# Patient Record
Sex: Male | Born: 1970 | Race: White | Hispanic: No | Marital: Single | Smoking: Never smoker
Health system: Southern US, Community
[De-identification: ages and names within clinical notes are randomized; demographics above are authoritative.]

---

## 2001-09-02 ENCOUNTER — Emergency Department (HOSPITAL_COMMUNITY): Admission: EM | Admit: 2001-09-02 | Discharge: 2001-09-02 | Payer: Self-pay | Admitting: Emergency Medicine

## 2002-07-28 ENCOUNTER — Emergency Department (HOSPITAL_COMMUNITY): Admission: EM | Admit: 2002-07-28 | Discharge: 2002-07-28 | Payer: Self-pay | Admitting: *Deleted

## 2002-07-28 ENCOUNTER — Encounter: Payer: Self-pay | Admitting: *Deleted

## 2013-04-20 ENCOUNTER — Emergency Department (HOSPITAL_COMMUNITY): Payer: Self-pay

## 2013-04-20 ENCOUNTER — Encounter (HOSPITAL_COMMUNITY): Payer: Self-pay | Admitting: Emergency Medicine

## 2013-04-20 ENCOUNTER — Emergency Department (HOSPITAL_COMMUNITY)
Admission: EM | Admit: 2013-04-20 | Discharge: 2013-04-20 | Disposition: A | Discharge status: Home / Self Care | Payer: Self-pay | Attending: Emergency Medicine | Admitting: Emergency Medicine

## 2013-04-20 DIAGNOSIS — R141 Gas pain: Secondary | ICD-10-CM | POA: Insufficient documentation

## 2013-04-20 DIAGNOSIS — R142 Eructation: Secondary | ICD-10-CM | POA: Insufficient documentation

## 2013-04-20 DIAGNOSIS — R109 Unspecified abdominal pain: Secondary | ICD-10-CM

## 2013-04-20 DIAGNOSIS — R197 Diarrhea, unspecified: Secondary | ICD-10-CM | POA: Insufficient documentation

## 2013-04-20 DIAGNOSIS — R1033 Periumbilical pain: Secondary | ICD-10-CM | POA: Insufficient documentation

## 2013-04-20 DIAGNOSIS — Z880 Allergy status to penicillin: Secondary | ICD-10-CM | POA: Insufficient documentation

## 2013-04-20 DIAGNOSIS — K625 Hemorrhage of anus and rectum: Secondary | ICD-10-CM | POA: Insufficient documentation

## 2013-04-20 DIAGNOSIS — K920 Hematemesis: Secondary | ICD-10-CM | POA: Insufficient documentation

## 2013-04-20 LAB — CBC WITH DIFFERENTIAL/PLATELET
Basophils Relative: 1 % (ref 0–1)
Eosinophils Absolute: 0.2 10*3/uL (ref 0.0–0.7)
Lymphs Abs: 1.7 10*3/uL (ref 0.7–4.0)
MCH: 30.2 pg (ref 26.0–34.0)
MCHC: 34.7 g/dL (ref 30.0–36.0)
Monocytes Absolute: 0.5 10*3/uL (ref 0.1–1.0)
Neutrophils Relative %: 54 % (ref 43–77)
Platelets: 286 10*3/uL (ref 150–400)
RDW: 13.4 % (ref 11.5–15.5)
WBC: 5.2 10*3/uL (ref 4.0–10.5)

## 2013-04-20 LAB — COMPREHENSIVE METABOLIC PANEL
ALT: 35 U/L (ref 0–53)
AST: 28 U/L (ref 0–37)
Albumin: 4.2 g/dL (ref 3.5–5.2)
Alkaline Phosphatase: 78 U/L (ref 39–117)
Calcium: 9.5 mg/dL (ref 8.4–10.5)
Potassium: 4.1 mEq/L (ref 3.7–5.3)
Sodium: 141 mEq/L (ref 137–147)
Total Protein: 8.3 g/dL (ref 6.0–8.3)

## 2013-04-20 LAB — URINALYSIS, ROUTINE W REFLEX MICROSCOPIC
Bilirubin Urine: NEGATIVE
Glucose, UA: NEGATIVE mg/dL
Hgb urine dipstick: NEGATIVE
Ketones, ur: NEGATIVE mg/dL
Leukocytes, UA: NEGATIVE
Nitrite: NEGATIVE
Protein, ur: NEGATIVE mg/dL
pH: 6.5 (ref 5.0–8.0)

## 2013-04-20 LAB — LIPASE, BLOOD: Lipase: 16 U/L (ref 11–59)

## 2013-04-20 MED ORDER — IOHEXOL 300 MG/ML  SOLN
100.0000 mL | Freq: Once | INTRAMUSCULAR | Status: AC | PRN
  Administered 2013-04-20: 100 mL via INTRAVENOUS

## 2013-04-20 MED ORDER — ONDANSETRON HCL 4 MG/2ML IJ SOLN
4.0000 mg | Freq: Once | INTRAMUSCULAR | Status: AC
  Administered 2013-04-20: 4 mg via INTRAVENOUS
  Filled 2013-04-20: qty 2

## 2013-04-20 MED ORDER — IOHEXOL 300 MG/ML  SOLN
50.0000 mL | Freq: Once | INTRAMUSCULAR | Status: AC | PRN
  Administered 2013-04-20: 50 mL via ORAL

## 2013-04-20 MED ORDER — MORPHINE SULFATE 4 MG/ML IJ SOLN
4.0000 mg | Freq: Once | INTRAMUSCULAR | Status: AC
  Administered 2013-04-20: 4 mg via INTRAVENOUS
  Filled 2013-04-20: qty 1

## 2013-04-20 MED ORDER — PROMETHAZINE HCL 25 MG PO TABS: 25.0000 mg | ORAL_TABLET | Freq: Four times a day (QID) | ORAL | Status: DC | PRN

## 2013-04-20 MED ORDER — OXYCODONE-ACETAMINOPHEN 5-325 MG PO TABS: 2.0000 | ORAL_TABLET | ORAL | Status: DC | PRN

## 2013-04-20 NOTE — ED Notes (Signed)
Patient given discharge instruction, verbalized understand. IV removed, band aid applied. Patient ambulatory out of the department.  

## 2013-04-20 NOTE — ED Notes (Signed)
Pt. Reports abdominal pain 5/10 sharp, states pain worse with movement. Pt. Reports diarrhea x3 weeks. Pt. Reports blood in vomit and stool.

## 2013-04-20 NOTE — ED Notes (Signed)
Pt had abdominal pain x4 weeks, today pain is worse, abdomen is distended, vomiting started today, stools noticed dark stools, diarrhea and bright red blood

## 2013-04-20 NOTE — ED Provider Notes (Signed)
CSN: [id number]     Arrival date & time 04/20/13  1144 History  This chart was scribed for Donnetta Hutching, MD by Dorothey Baseman, ED Scribe. This patient was seen in room APA09/APA09 and the patient's care was started at 1:52 PM.    Chief Complaint  Patient presents with  . Abdominal Pain  . Rectal Bleeding  . Hematemesis   The history is provided by the patient. No language interpreter was used.   HPI Comments: William Francis is a 42 y.o. male who presents to the Emergency Department complaining of a constant pain to the periumbilical region of the abdomen with associated abdominal bloating and diarrhea onset 3-4 weeks ago. He reports one episode of emesis today. He reports that he has been handling food and fluids relatively well, but has not eaten today. Patient reports that these symptoms are new for him. He denies history of abdominal surgery. Patient has no other pertinent medical history. Patient does not drink or smoke.  History reviewed. No pertinent past medical history. History reviewed. No pertinent past surgical history. History reviewed. No pertinent family history. History  Substance Use Topics  . Smoking status: Not on file  . Smokeless tobacco: Not on file  . Alcohol Use: Not on file    Review of Systems  A complete 10 system review of systems was obtained and all systems are negative except as noted in the HPI and PMH.   Allergies  Penicillins  Home Medications   Current Outpatient Rx  Name  Route  Sig  Dispense  Refill  . hydrocortisone (CORTIZONE-10 ECZEMA) 1 % lotion   Topical   Apply 1 application topically daily as needed for itching (skin irritation).         Marland Kitchen oxyCODONE-acetaminophen (PERCOCET) 5-325 MG per tablet   Oral   Take 2 tablets by mouth every 4 (four) hours as needed.   20 tablet   0   . promethazine (PHENERGAN) 25 MG tablet   Oral   Take 1 tablet (25 mg total) by mouth every 6 (six) hours as needed for nausea or vomiting.   20 tablet   0      Triage Vitals: BP 136/92  Pulse 86  Temp(Src) 98.5 F (36.9 C) (Oral)  Resp 20  Ht 6\' 1"  (1.854 m)  Wt 220 lb (99.791 kg)  BMI 29.03 kg/m2  SpO2 96%  Physical Exam  Nursing note and vitals reviewed. Constitutional: He is oriented to person, place, and time. He appears well-developed and well-nourished. No distress.  HENT:  Head: Normocephalic and atraumatic.  Eyes: Conjunctivae are normal.  Neck: Normal range of motion. Neck supple.  Cardiovascular: Normal rate, regular rhythm and normal heart sounds.   Pulmonary/Chest: Effort normal and breath sounds normal. No respiratory distress.  Abdominal: Soft. He exhibits distension. There is tenderness.  Abdomen is protuberant. Diffuse tenderness to palpation.  Musculoskeletal: Normal range of motion.  Neurological: He is alert and oriented to person, place, and time.  Skin: Skin is warm and dry.  Psychiatric: He has a normal mood and affect. His behavior is normal.    ED Course  Procedures (including critical care time)  DIAGNOSTIC STUDIES: Oxygen Saturation is 96% on room air, normal by my interpretation.    COORDINATION OF CARE: 1:54 PM- Will order a CT of the abdomen, blood labs, and UA. Will order Omnipaque, morphine, and Zofran to manage symptoms. Discussed treatment plan with patient at bedside and patient verbalized agreement.  Labs Review Labs Reviewed  COMPREHENSIVE METABOLIC PANEL - Abnormal; Notable for the following:    Glucose, Bld 100 (*)    Total Bilirubin 0.2 (*)    GFR calc non Af Amer 89 (*)    All other components within normal limits  CBC WITH DIFFERENTIAL  LIPASE, BLOOD  URINALYSIS, ROUTINE W REFLEX MICROSCOPIC   Imaging Review Ct Abdomen Pelvis W Contrast  04/20/2013   CLINICAL DATA:  Vomiting.  Abdominal pain.  EXAM: CT ABDOMEN AND PELVIS WITH CONTRAST  TECHNIQUE: Multidetector CT imaging of the abdomen and pelvis was performed using the standard protocol following bolus administration of  intravenous contrast.  CONTRAST:  50mL OMNIPAQUE IOHEXOL 300 MG/ML SOLN, OMNIPAQUE IOHEXOL 300 MG/ML SOLN  COMPARISON:  Lung bases are clear.  No pleural or pericardial fluid.  There is mild fatty change of the liver. No focal lesions or ductal dilatation. No calcified gallstones. The spleen is normal. The pancreas is normal. The adrenal glands are normal. The kidneys are normal. The aorta and IVC are normal. No retroperitoneal mass or adenopathy. No free intraperitoneal fluid or air. No sign of bowel obstruction. The appendix is very slightly dilated, showing a diameter of up to 9 mm. One could question early inflammatory change around the appendix. I think this examination raises the possibility of early appendicitis, but I cannot make that diagnosis with certainty.  Bladder, prostate gland and seminal vesicles are unremarkable. No significant bony finding.  FINDINGS: The appendix is borderline abnormal. Diameter is up to 9 mm. I question if there could be very early stranding around the appendix. Correlation with clinical presentation and laboratory findings is obviously appropriate. At the very least, close clinical follow-up would be suggested. The study does not allow confident diagnosis of appendicitis at this moment.   Electronically Signed   By: Paulina Fusi M.D.   On: 04/20/2013 15:58    EKG Interpretation   None       MDM   1. Abdominal pain    No acute abdomen. White count normal. Discussed findings with patient. Also discussed case with Dr. Franky Macho.  Patient understands to return if pain settles in right lower abdomen which could be a sign of appendicitis.  Medication for pain and nausea.  I personally performed the services described in this documentation, which was scribed in my presence. The recorded information has been reviewed and is accurate.     Donnetta Hutching, MD 04/20/13 [phone]

## 2013-09-25 ENCOUNTER — Emergency Department (HOSPITAL_COMMUNITY)
Admission: EM | Admit: 2013-09-25 | Discharge: 2013-09-25 | Disposition: A | Discharge status: Home / Self Care | Payer: Self-pay | Attending: Emergency Medicine | Admitting: Emergency Medicine

## 2013-09-25 ENCOUNTER — Encounter (HOSPITAL_COMMUNITY): Payer: Self-pay | Admitting: Emergency Medicine

## 2013-09-25 DIAGNOSIS — IMO0002 Reserved for concepts with insufficient information to code with codable children: Secondary | ICD-10-CM | POA: Insufficient documentation

## 2013-09-25 DIAGNOSIS — S0500XA Injury of conjunctiva and corneal abrasion without foreign body, unspecified eye, initial encounter: Secondary | ICD-10-CM

## 2013-09-25 DIAGNOSIS — Y929 Unspecified place or not applicable: Secondary | ICD-10-CM | POA: Insufficient documentation

## 2013-09-25 DIAGNOSIS — Z880 Allergy status to penicillin: Secondary | ICD-10-CM | POA: Insufficient documentation

## 2013-09-25 DIAGNOSIS — S058X9A Other injuries of unspecified eye and orbit, initial encounter: Secondary | ICD-10-CM | POA: Insufficient documentation

## 2013-09-25 DIAGNOSIS — Y939 Activity, unspecified: Secondary | ICD-10-CM | POA: Insufficient documentation

## 2013-09-25 MED ORDER — TETRACAINE HCL 0.5 % OP SOLN
1.0000 [drp] | Freq: Once | OPHTHALMIC | Status: AC
  Administered 2013-09-25: 1 [drp] via OPHTHALMIC

## 2013-09-25 MED ORDER — IBUPROFEN 200 MG PO TABS
400.0000 mg | ORAL_TABLET | Freq: Once | ORAL | Status: AC
  Administered 2013-09-25: 400 mg via ORAL
  Filled 2013-09-25: qty 2

## 2013-09-25 MED ORDER — CIPROFLOXACIN HCL 0.3 % OP OINT: TOPICAL_OINTMENT | OPHTHALMIC | Status: DC

## 2013-09-25 MED ORDER — OXYCODONE-ACETAMINOPHEN 5-325 MG PO TABS
1.0000 | ORAL_TABLET | Freq: Once | ORAL | Status: AC
  Administered 2013-09-25: 1 via ORAL
  Filled 2013-09-25: qty 1

## 2013-09-25 MED ORDER — TETRACAINE HCL 0.5 % OP SOLN
1.0000 [drp] | Freq: Once | OPHTHALMIC | Status: DC
Start: 2013-09-25 — End: 2013-09-25
  Filled 2013-09-25: qty 2

## 2013-09-25 MED ORDER — OXYCODONE-ACETAMINOPHEN 5-325 MG PO TABS: 1.0000 | ORAL_TABLET | ORAL | Status: DC | PRN

## 2013-09-25 MED ORDER — TETRACAINE HCL 0.5 % OP SOLN
1.0000 [drp] | Freq: Once | OPHTHALMIC | Status: AC
  Administered 2013-09-25: 1 [drp] via OPHTHALMIC
  Filled 2013-09-25: qty 2

## 2013-09-25 MED ORDER — FLUORESCEIN SODIUM 1 MG OP STRP
1.0000 | ORAL_STRIP | Freq: Once | OPHTHALMIC | Status: AC
  Administered 2013-09-25: 1 via OPHTHALMIC
  Filled 2013-09-25: qty 1

## 2013-09-25 NOTE — ED Provider Notes (Signed)
CSN: [id number]     Arrival date & time 09/25/13  0569 History   First MD Initiated Contact with Patient 09/25/13 [phone]     Chief Complaint  Patient presents with  . Eye Injury  . Eye Pain     (Consider location/radiation/quality/duration/timing/severity/associated sxs/prior Treatment) HPI  Patient presents with left eye pain.  States someone was holding a door for him last night and when they let go, a branch that was also being held back whipped back and hit him in the left eye.  It was a small pine tree branch.  States he drove to the hospital last night but felt better when he got here so he decided to go home.  Woke up this morning with worse pain.  States it feels like something is in his eye, constantly scratching it.  Vision is blurry.  Clear discharge.  Denies other injury.   He has not used any medication or drops in his eye.  Pain is moderate, located only in the left eye, feels like something "scratching", no radiation.    History reviewed. No pertinent past medical history. History reviewed. No pertinent past surgical history. No family history on file. History  Substance Use Topics  . Smoking status: Never Smoker   . Smokeless tobacco: Not on file  . Alcohol Use: No    Review of Systems  All other systems reviewed and are negative.     Allergies  Penicillins  Home Medications   Prior to Admission medications   Not on File   BP 132/94  Pulse 76  Temp(Src) 97.7 F (36.5 C) (Oral)  Resp 20  SpO2 99% Physical Exam  Nursing note and vitals reviewed. Constitutional: He appears well-developed and well-nourished. No distress.  HENT:  Head: Normocephalic and atraumatic.  Eyes: EOM and lids are normal. Pupils are equal, round, and reactive to light. Lids are everted and swept, no foreign bodies found. Right eye exhibits no discharge. Left eye exhibits no discharge. Right conjunctiva is not injected. Right conjunctiva has no hemorrhage. Left conjunctiva is injected.  Left conjunctiva has no hemorrhage. No scleral icterus.  Slit lamp exam:      The left eye shows corneal abrasion.    Neck: Neck supple.  Pulmonary/Chest: Effort normal.  Neurological: He is alert.  Skin: He is not diaphoretic.    ED Course  Procedures (including critical care time) Labs Review Labs Reviewed - No data to display  Imaging Review No results found.   EKG Interpretation None      MDM   Final diagnoses:  Corneal abrasion    Pt with corneal abrasion of left eye that occurred last night.  Tdap is up to date (past 3 years).  He does not wear contacts.  No e/o FB on exam.  No e/o globe rupture.  D/C home with percocet and cipro ophthalmic drops with ophthalmology follow up.  Discussed findings, treatment, and follow up  with patient.  Pt given return precautions.  Pt verbalizes understanding and agrees with plan.        Honea Path, PA-C 09/25/13 [phone]

## 2013-09-25 NOTE — Discharge Instructions (Signed)
Read the information below.  Use the prescribed medication as directed.  Please discuss all new medications with your pharmacist.  Do not take additional tylenol while taking the prescribed pain medication to avoid overdose.  You Beadles return to the Emergency Department at any time for worsening condition or any new symptoms that concern you.  If you develop uncontrolled pain, change in your vision, abnormal discharge from your eye, see the eye doctor listed above or return to the Emergency Department for a recheck.    Corneal Abrasion The cornea is the clear covering at the front and center of the eye. When looking at the colored portion of the eye (iris), you are looking through the cornea. This very thin tissue is made up of many layers. The surface layer is a single layer of cells (corneal epithelium) and is one of the most sensitive tissues in the body. If a scratch or injury causes the corneal epithelium to come off, it is called a corneal abrasion. If the injury extends to the tissues below the epithelium, the condition is called a corneal ulcer. CAUSES   Scratches.  Trauma.  Foreign body in the eye. Some people have recurrences of abrasions in the area of the original injury even after it has healed (recurrent erosion syndrome). Recurrent erosion syndrome generally improves and goes away with time. SYMPTOMS   Eye pain.  Difficulty or inability to keep the injured eye open.  The eye becomes very sensitive to light.  Recurrent erosions tend to happen suddenly, first thing in the morning, usually after waking up and opening the eye. DIAGNOSIS  Your health care provider can diagnose a corneal abrasion during an eye exam. Dye is usually placed in the eye using a drop or a small paper strip moistened by your tears. When the eye is examined with a special light, the abrasion shows up clearly because of the dye. TREATMENT   Small abrasions [place] be treated with antibiotic drops or ointment  alone.  Usually a pressure patch is specially applied. Pressure patches prevent the eye from blinking, allowing the corneal epithelium to heal. A pressure patch also reduces the amount of pain present in the eye during healing. Most corneal abrasions heal within 2 3 days with no effect on vision. If the abrasion becomes infected and spreads to the deeper tissues of the cornea, a corneal ulcer can result. This is serious because it can cause corneal scarring. Corneal scars interfere with light passing through the cornea and cause a loss of vision in the involved eye. HOME CARE INSTRUCTIONS  Use medicine or ointment as directed. Only take over-the-counter or prescription medicines for pain, discomfort, or fever as directed by your health care provider.  Do not drive or operate machinery while your eye is patched. Your ability to judge distances is impaired.  If your health care provider has given you a follow-up appointment, it is very important to keep that appointment. Not keeping the appointment could result in a severe eye infection or permanent loss of vision. If there is any problem keeping the appointment, let your health care provider know. SEEK MEDICAL CARE IF:   You have pain, light sensitivity, and a scratchy feeling in one eye or both eyes.  Your pressure patch keeps loosening up, and you can blink your eye under the patch after treatment.  Any kind of discharge develops from the eye after treatment or if the lids stick together in the morning.  You have the same symptoms in  the morning as you did with the original abrasion days, weeks, or months after the abrasion healed. MAKE SURE YOU:   Understand these instructions.  Will watch your condition.  Will get help right away if you are not doing well or get worse. Document Released: 04/05/2000 Document Revised: 01/27/2013 Document Reviewed: 12/14/2012 [facility] Patient Information 2014 [place], [place].

## 2013-09-25 NOTE — ED Notes (Signed)
Pt declined W/C at D/C and Pt was escorted to [facility] by [employer]

## 2013-09-25 NOTE — ED Notes (Signed)
C/o L eye injury, pine tree branch hit me in the face/ L eye, L eye irritated, red, tearing. "feels like there is something in my eye". Rates L eye pain 6/10. PERRL 88mm, brisk.

## 2013-09-25 NOTE — ED Provider Notes (Signed)
Medical screening examination/treatment/procedure(s) were performed by non-physician practitioner and as supervising physician I was immediately available for consultation/collaboration.    Oshen Wlodarczyk E Fortino Haag, MD 09/25/13 1637 

## 2014-09-28 ENCOUNTER — Encounter (HOSPITAL_BASED_OUTPATIENT_CLINIC_OR_DEPARTMENT_OTHER): Payer: Self-pay

## 2014-09-28 ENCOUNTER — Emergency Department (HOSPITAL_BASED_OUTPATIENT_CLINIC_OR_DEPARTMENT_OTHER)
Admission: EM | Admit: 2014-09-28 | Discharge: 2014-09-29 | Disposition: A | Discharge status: Home / Self Care | Payer: Self-pay | Attending: Emergency Medicine | Admitting: Emergency Medicine

## 2014-09-28 DIAGNOSIS — L03114 Cellulitis of left upper limb: Secondary | ICD-10-CM | POA: Insufficient documentation

## 2014-09-28 DIAGNOSIS — Z880 Allergy status to penicillin: Secondary | ICD-10-CM | POA: Insufficient documentation

## 2014-09-28 LAB — CBC WITH DIFFERENTIAL/PLATELET
Basophils Absolute: 0 10*3/uL (ref 0.0–0.1)
Basophils Relative: 0 % (ref 0–1)
EOS ABS: 0.3 10*3/uL (ref 0.0–0.7)
Eosinophils Relative: 3 % (ref 0–5)
HCT: 41.9 % (ref 39.0–52.0)
Hemoglobin: 14.2 g/dL (ref 13.0–17.0)
Lymphocytes Relative: 20 % (ref 12–46)
Lymphs Abs: 2 10*3/uL (ref 0.7–4.0)
MCH: 29.5 pg (ref 26.0–34.0)
MCHC: 33.9 g/dL (ref 30.0–36.0)
MCV: 86.9 fL (ref 78.0–100.0)
Monocytes Absolute: 1.1 10*3/uL — ABNORMAL HIGH (ref 0.1–1.0)
Monocytes Relative: 11 % (ref 3–12)
NEUTROS ABS: 6.6 10*3/uL (ref 1.7–7.7)
NEUTROS PCT: 66 % (ref 43–77)
PLATELETS: 274 10*3/uL (ref 150–400)
RBC: 4.82 MIL/uL (ref 4.22–5.81)
RDW: 13.8 % (ref 11.5–15.5)
WBC: 10 10*3/uL (ref 4.0–10.5)

## 2014-09-28 LAB — BASIC METABOLIC PANEL
Anion gap: 5 (ref 5–15)
BUN: 16 mg/dL (ref 6–20)
CALCIUM: 8.6 mg/dL — AB (ref 8.9–10.3)
CO2: 28 mmol/L (ref 22–32)
CREATININE: 0.95 mg/dL (ref 0.61–1.24)
Chloride: 104 mmol/L (ref 101–111)
Glucose, Bld: 106 mg/dL — ABNORMAL HIGH (ref 65–99)
POTASSIUM: 3.9 mmol/L (ref 3.5–5.1)
Sodium: 137 mmol/L (ref 135–145)

## 2014-09-28 MED ORDER — VANCOMYCIN HCL IN DEXTROSE 1-5 GM/200ML-% IV SOLN
1000.0000 mg | Freq: Once | INTRAVENOUS | Status: AC
  Administered 2014-09-28: 1000 mg via INTRAVENOUS
  Filled 2014-09-28: qty 200

## 2014-09-28 NOTE — ED Notes (Signed)
Redness to left forearm to areas from tattoo last night

## 2014-09-28 NOTE — ED Provider Notes (Signed)
CSN: [id number]     Arrival date & time 09/28/14  2105 History  This chart was scribed for William PorterMark Sade Hollon, MD by Octavia HeirArianna Nassar, ED Scribe. This patient was seen in room MH12/MH12 and the patient's care was started at 10:35 PM.    Chief Complaint  Patient presents with  . Cellulitis     The history is provided by the patient. No language interpreter was used.    HPI Comments: Anselm PancoastWilliam J Meriweather is a 44 y.o. male who presents to the Emergency Department complaining of constant, gradual worsening left forearm pain onset last night. Pt has associated redness. Pt received a circumferential tattoo last night around his left forearm when he noticed redness, swelling, and draining from the tattoo. Pt denies fevers, shakes and chills. Pt is allergic to penicillin.   History reviewed. No pertinent past medical history. History reviewed. No pertinent past surgical history. No family history on file. History  Substance Use Topics  . Smoking status: Never Smoker   . Smokeless tobacco: Not on file  . Alcohol Use: No    Review of Systems  Constitutional: Negative for fever, chills, diaphoresis, appetite change and fatigue.  HENT: Negative for mouth sores, sore throat and trouble swallowing.   Eyes: Negative for visual disturbance.  Respiratory: Negative for cough, chest tightness, shortness of breath and wheezing.   Cardiovascular: Negative for chest pain.  Gastrointestinal: Negative for nausea, vomiting, abdominal pain, diarrhea and abdominal distention.  Endocrine: Negative for polydipsia, polyphagia and polyuria.  Genitourinary: Negative for dysuria, frequency and hematuria.  Musculoskeletal: Negative for gait problem.  Skin: Positive for wound. Negative for color change, pallor and rash.  Neurological: Negative for dizziness, syncope, light-headedness and headaches.  Hematological: Does not bruise/bleed easily.  Psychiatric/Behavioral: Negative for behavioral problems and confusion.       Allergies  Penicillins  Home Medications   Prior to Admission medications   Medication Sig Start Date End Date Taking? Authorizing Provider  clindamycin (CLEOCIN) 300 MG capsule Take 1 capsule (300 mg total) by mouth 3 (three) times daily. 09/29/14   William PorterMark Nadalyn Deringer, MD  HYDROcodone-acetaminophen (NORCO/VICODIN) 5-325 MG per tablet Take 1 tablet by mouth every 4 (four) hours as needed. 09/29/14   William PorterMark Rashel Okeefe, MD   Triage vitals: BP 126/87 mmHg  Pulse 94  Temp(Src) 98.6 F (37 C) (Oral)  Resp 18  SpO2 100% Physical Exam  Constitutional: He is oriented to person, place, and time. He appears well-developed and well-nourished. No distress.  HENT:  Head: Normocephalic.  Eyes: Conjunctivae are normal. Pupils are equal, round, and reactive to light. No scleral icterus.  Neck: Normal range of motion. Neck supple. No thyromegaly present.  Cardiovascular: Normal rate and regular rhythm.  Exam reveals no gallop and no friction rub.   No murmur heard. Pulmonary/Chest: Effort normal and breath sounds normal. No respiratory distress. He has no wheezes. He has no rales.  Abdominal: Soft. Bowel sounds are normal. He exhibits no distension. There is no tenderness. There is no rebound.  Musculoskeletal: Normal range of motion.  Neurological: He is alert and oriented to person, place, and time.  Skin: Skin is warm and dry. No rash noted.  Circumferential tattooing , no crepitus  4cm lymphangitis left medial distal upper arm Diffuse cellulitis of the left forearm  Psychiatric: He has a normal mood and affect. His behavior is normal.  Nursing note and vitals reviewed.   ED Course  Procedures  DIAGNOSTIC STUDIES: Oxygen Saturation is 100% on RA, normal by my  interpretation.  COORDINATION OF CARE:  10:38 PM Discussed treatment plan which includes antibiotics and pain medication with pt at bedside and pt agreed to plan.   Labs Review Labs Reviewed  CBC WITH DIFFERENTIAL/PLATELET - Abnormal;  Notable for the following:    Monocytes Absolute 1.1 (*)    All other components within normal limits  BASIC METABOLIC PANEL - Abnormal; Notable for the following:    Glucose, Bld 106 (*)    Calcium 8.6 (*)    All other components within normal limits    Imaging Review No results found.   EKG Interpretation None      MDM   Final diagnoses:  Cellulitis of left upper extremity    I personally performed the services described in this documentation, which was scribed in my presence. The recorded information has been reviewed and is accurate.   William Porter, MD 10/05/14 [phone]

## 2014-09-28 NOTE — ED Notes (Signed)
Redness to inner forearm marked

## 2014-09-28 NOTE — ED Notes (Signed)
Pt stopped triage process to take a phone call

## 2014-09-29 MED ORDER — CLINDAMYCIN HCL 300 MG PO CAPS: 300.0000 mg | ORAL_CAPSULE | Freq: Three times a day (TID) | ORAL | Status: DC

## 2014-09-29 MED ORDER — HYDROCODONE-ACETAMINOPHEN 5-325 MG PO TABS
1.0000 | ORAL_TABLET | ORAL | Status: DC | PRN
Start: 2014-09-29 — End: 2023-10-16

## 2014-09-29 MED ORDER — ONDANSETRON HCL 4 MG/2ML IJ SOLN
4.0000 mg | Freq: Once | INTRAMUSCULAR | Status: AC
  Administered 2014-09-29: 4 mg via INTRAVENOUS
  Filled 2014-09-29: qty 2

## 2014-09-29 MED ORDER — MORPHINE SULFATE 4 MG/ML IJ SOLN
4.0000 mg | INTRAMUSCULAR | Status: DC | PRN
Start: 2014-09-29 — End: 2014-09-29
  Administered 2014-09-29: 4 mg via INTRAVENOUS
  Filled 2014-09-29: qty 1

## 2014-09-29 NOTE — Discharge Instructions (Signed)
Keep your arm elevated. Check here with any worsening symptoms including fever, worsening pain, or spread of the redness higher up your  arm.  Cellulitis Cellulitis is an infection of the skin and the tissue under the skin. The infected area is usually red and tender. This happens most often in the arms and lower legs. HOME CARE   Take your antibiotic medicine as told. Finish the medicine even if you start to feel better.  Keep the infected arm or leg raised (elevated).  Put a warm cloth on the area up to 4 times per day.  Only take medicines as told by your doctor.  Keep all doctor visits as told. GET HELP IF:  You see red streaks on the skin coming from the infected area.  Your red area gets bigger or turns a dark color.  Your bone or joint under the infected area is painful after the skin heals.  Your infection comes back in the same area or different area.  You have a puffy (swollen) bump in the infected area.  You have new symptoms.  You have a fever. GET HELP RIGHT AWAY IF:   You feel very sleepy.  You throw up (vomit) or have watery poop (diarrhea).  You feel sick and have muscle aches and pains. MAKE SURE YOU:   Understand these instructions.  Will watch your condition.  Will get help right away if you are not doing well or get worse. Document Released: 09/25/2007 Document Revised: 08/23/2013 Document Reviewed: 06/24/2011 [facility] Patient Information 2015 [place], [place]. This information is not intended to replace advice given to you by your health care provider. Make sure you discuss any questions you have with your health care provider.

## 2014-10-28 IMAGING — CT CT ABD-PELV W/ CM
2 of 4 series · 16 of 46 positions shown, 18 images · IV contrast (Omnipaque 300)
Comparison: Lung bases are clear.

CLINICAL DATA: Vomiting.  Abdominal pain.

EXAM:
CT ABDOMEN AND PELVIS WITH CONTRAST
TECHNIQUE: Multidetector CT imaging of the abdomen and pelvis was performed
using the standard protocol following bolus administration of
intravenous contrast.
CONTRAST:  50mL OMNIPAQUE IOHEXOL 300 MG/ML SOLN, 100mL OMNIPAQUE
IOHEXOL 300 MG/ML SOLN

[Series 2: abd_pel_with 5.0 b40f · axial · 0.71mm/px · z∈[-517,-22]mm · 13 of 109 slices shown, 15 images]
[im 5/109  soft-tissue]
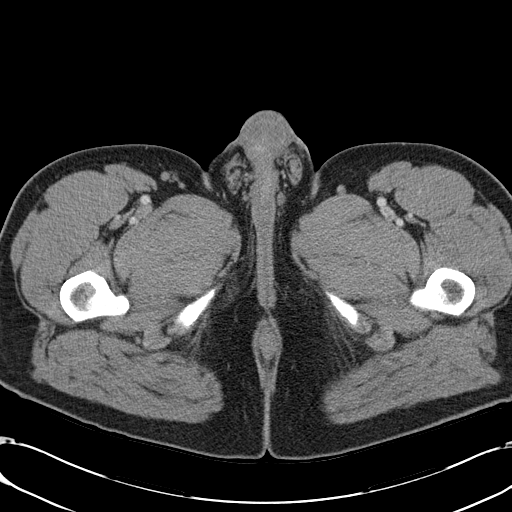
[im 5/109  bone]
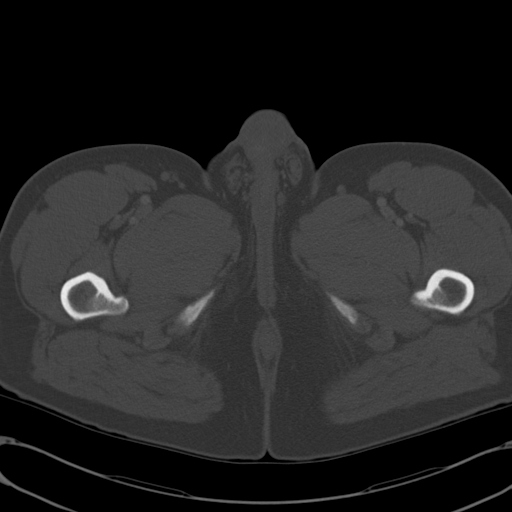
[im 13/109  soft-tissue]
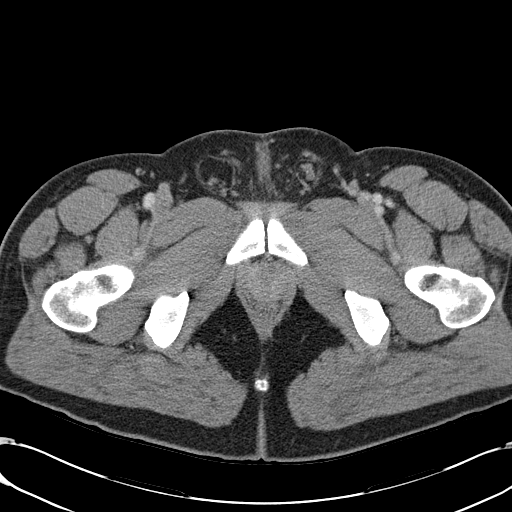
[im 22/109  soft-tissue]
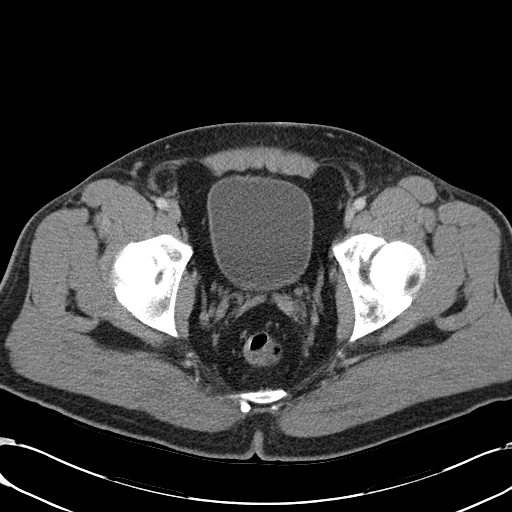
[im 31/109  soft-tissue]
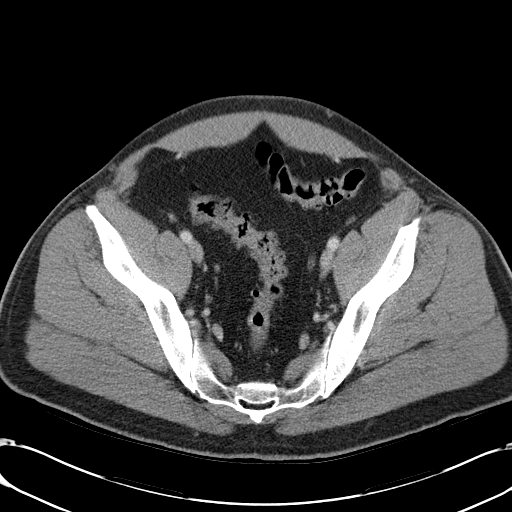
[im 39/109  soft-tissue]
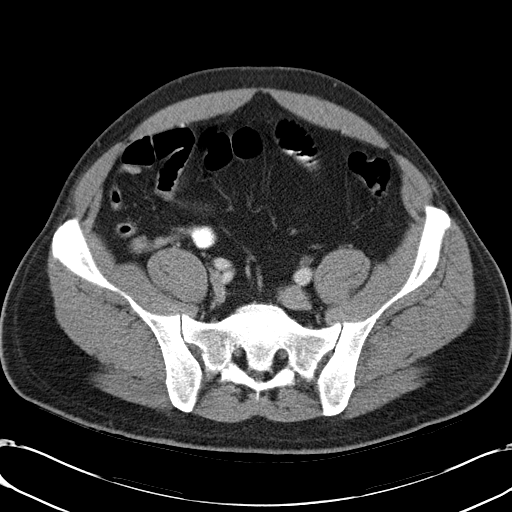
[im 48/109  soft-tissue]
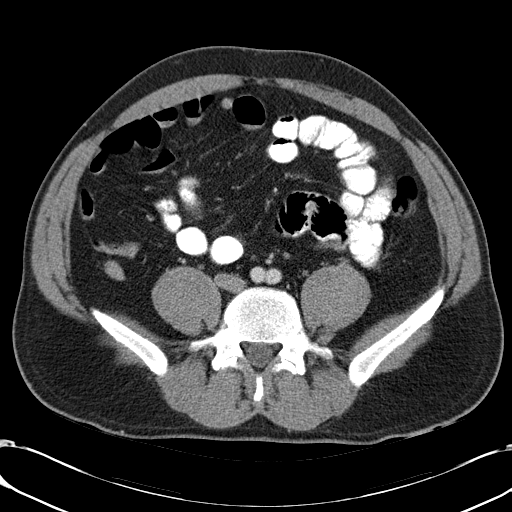
[im 57/109  soft-tissue]
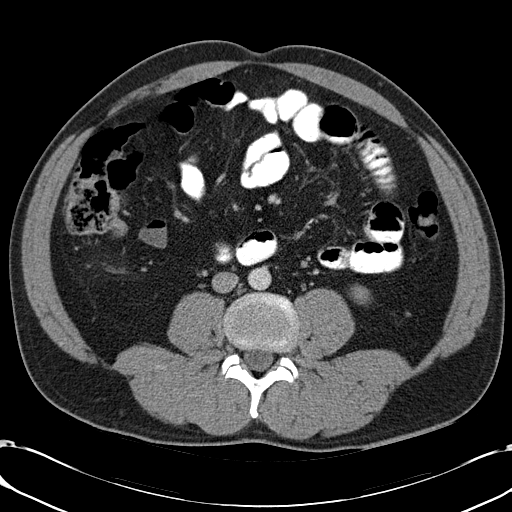
[im 61/109  soft-tissue]
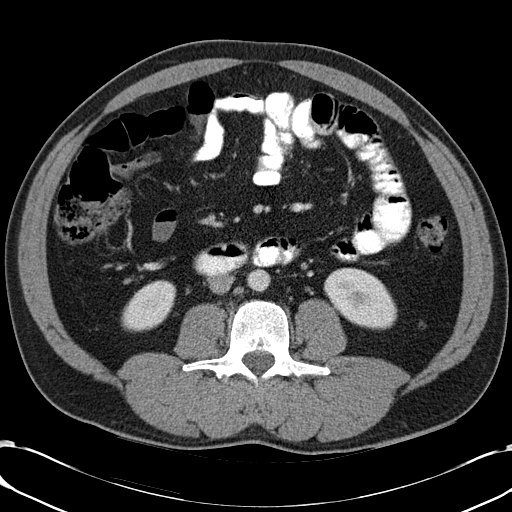
[im 70/109  soft-tissue]
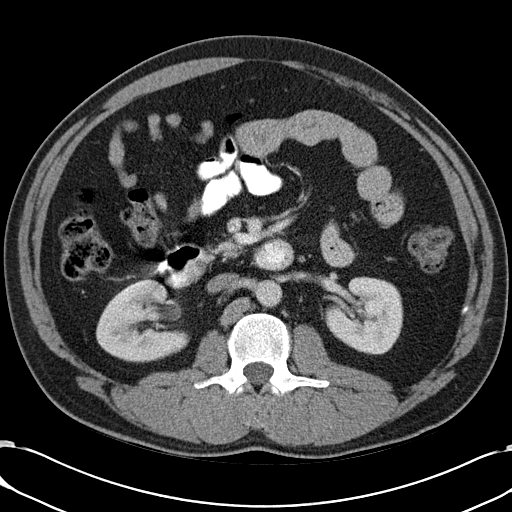
[im 70/109  bone]
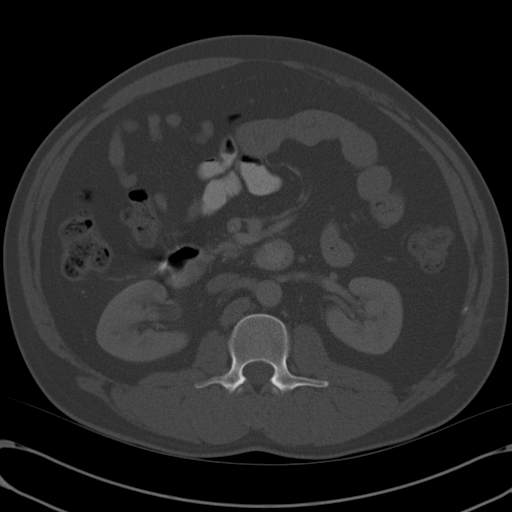
[im 78/109  soft-tissue]
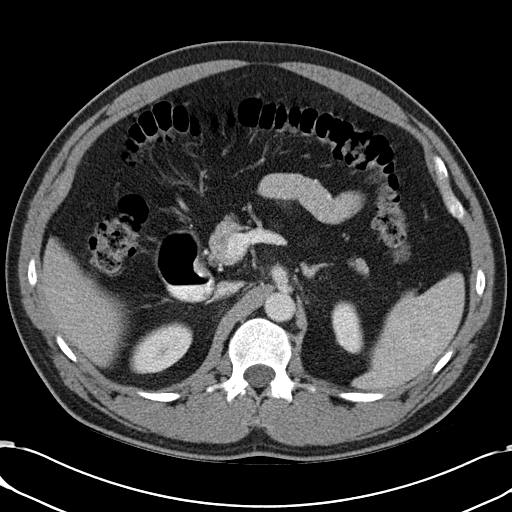
[im 87/109  soft-tissue]
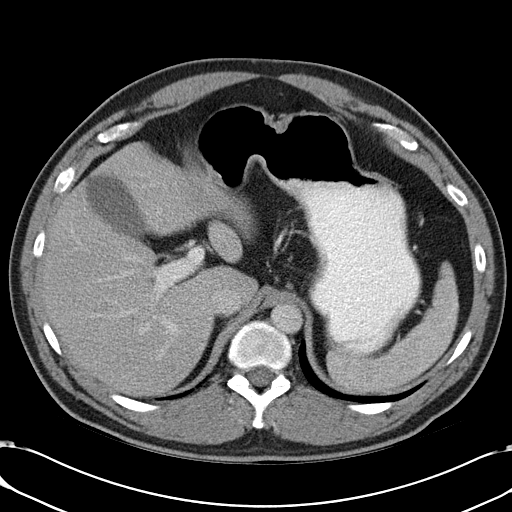
[im 96/109  soft-tissue]
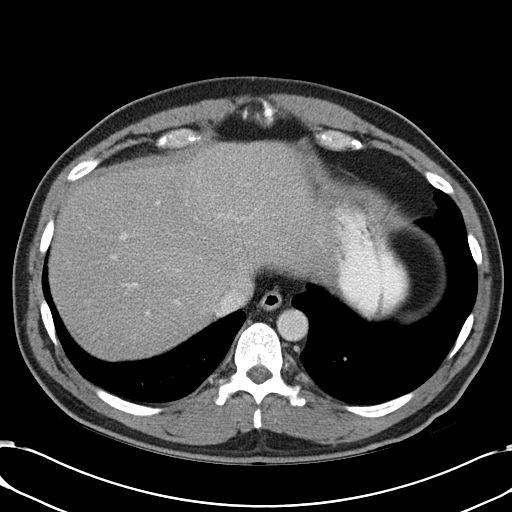
[im 104/109  soft-tissue]
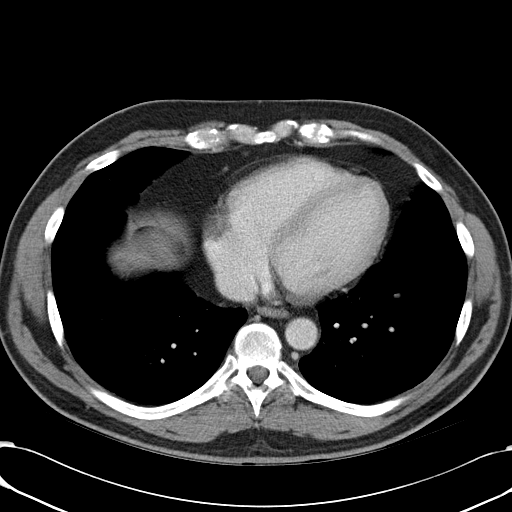

[Series 4: abd_pel_with 3.0 spo cor · coronal · 0.78mm/px · 3 of 98 slices shown]
[im 33/98  soft-tissue]
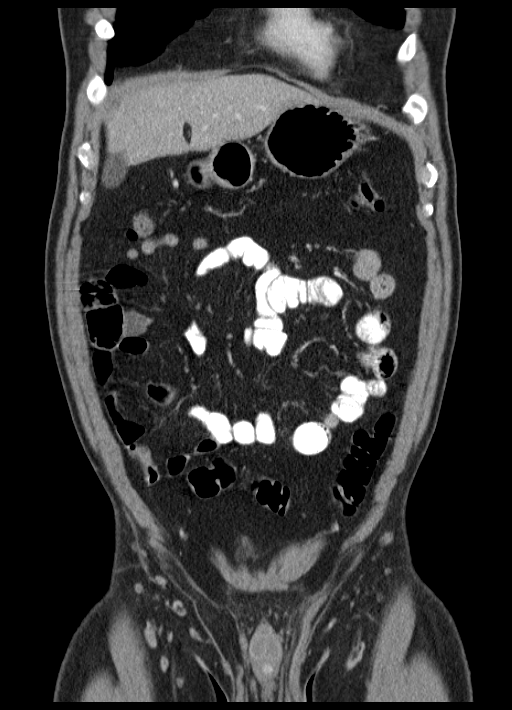
[im 44/98  soft-tissue]
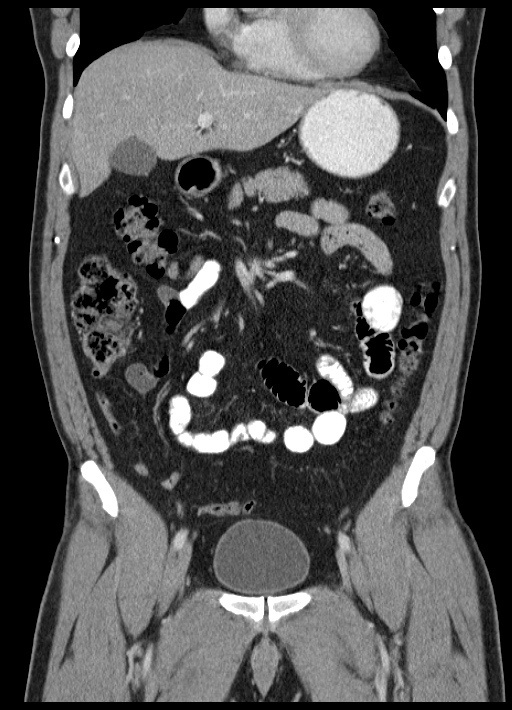
[im 54/98  soft-tissue]
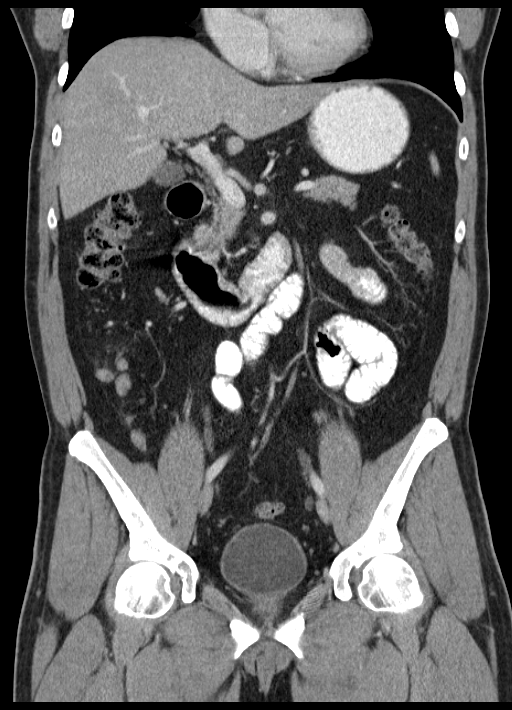

[16 of 46 positions shown; findings below may reference images not displayed]

No pleural or pericardial fluid.

There is mild fatty change of the liver. No focal lesions or ductal
dilatation. No calcified gallstones. The spleen is normal. The
pancreas is normal. The adrenal glands are normal. The kidneys are
normal. The aorta and IVC are normal. No retroperitoneal mass or
adenopathy. No free intraperitoneal fluid or air. No sign of bowel
obstruction. The appendix is very slightly dilated, showing a
diameter of up to 9 mm. One could question early inflammatory change
around the appendix. I think this examination raises the possibility
of early appendicitis, but I cannot make that diagnosis with
certainty.

Bladder, prostate gland and seminal vesicles are unremarkable. No
significant bony finding.
FINDINGS: The appendix is borderline abnormal. Diameter is up to 9 mm. I
question if there could be very early stranding around the appendix.
Correlation with clinical presentation and laboratory findings is
obviously appropriate. At the very least, close clinical follow-up
would be suggested. The study does not allow confident diagnosis of
appendicitis at this moment.

## 2023-10-16 ENCOUNTER — Inpatient Hospital Stay (HOSPITAL_COMMUNITY)
Admission: EM | Admit: 2023-10-16 | Discharge: 2023-10-23 | DRG: 330 | Attending: Internal Medicine | Admitting: Internal Medicine

## 2023-10-16 ENCOUNTER — Encounter (HOSPITAL_COMMUNITY): Payer: Self-pay

## 2023-10-16 ENCOUNTER — Other Ambulatory Visit: Payer: Self-pay

## 2023-10-16 ENCOUNTER — Emergency Department (HOSPITAL_COMMUNITY)

## 2023-10-16 DIAGNOSIS — K922 Gastrointestinal hemorrhage, unspecified: Principal | ICD-10-CM

## 2023-10-16 DIAGNOSIS — C187 Malignant neoplasm of sigmoid colon: Principal | ICD-10-CM | POA: Diagnosis present

## 2023-10-16 DIAGNOSIS — C189 Malignant neoplasm of colon, unspecified: Secondary | ICD-10-CM

## 2023-10-16 DIAGNOSIS — K6389 Other specified diseases of intestine: Secondary | ICD-10-CM | POA: Diagnosis present

## 2023-10-16 DIAGNOSIS — K921 Melena: Secondary | ICD-10-CM | POA: Diagnosis not present

## 2023-10-16 DIAGNOSIS — K648 Other hemorrhoids: Secondary | ICD-10-CM | POA: Diagnosis present

## 2023-10-16 DIAGNOSIS — Z87891 Personal history of nicotine dependence: Secondary | ICD-10-CM

## 2023-10-16 DIAGNOSIS — C772 Secondary and unspecified malignant neoplasm of intra-abdominal lymph nodes: Secondary | ICD-10-CM | POA: Diagnosis present

## 2023-10-16 DIAGNOSIS — R933 Abnormal findings on diagnostic imaging of other parts of digestive tract: Secondary | ICD-10-CM

## 2023-10-16 DIAGNOSIS — Z880 Allergy status to penicillin: Secondary | ICD-10-CM

## 2023-10-16 DIAGNOSIS — D62 Acute posthemorrhagic anemia: Secondary | ICD-10-CM | POA: Diagnosis present

## 2023-10-16 DIAGNOSIS — R739 Hyperglycemia, unspecified: Secondary | ICD-10-CM | POA: Diagnosis present

## 2023-10-16 DIAGNOSIS — R030 Elevated blood-pressure reading, without diagnosis of hypertension: Secondary | ICD-10-CM | POA: Diagnosis present

## 2023-10-16 DIAGNOSIS — K7689 Other specified diseases of liver: Secondary | ICD-10-CM | POA: Diagnosis present

## 2023-10-16 LAB — URINALYSIS, ROUTINE W REFLEX MICROSCOPIC
Bacteria, UA: NONE SEEN
Bilirubin Urine: NEGATIVE
Glucose, UA: NEGATIVE mg/dL
Ketones, ur: NEGATIVE mg/dL
Leukocytes,Ua: NEGATIVE
Nitrite: NEGATIVE
Protein, ur: NEGATIVE mg/dL
Specific Gravity, Urine: 1.023 (ref 1.005–1.030)
pH: 5 (ref 5.0–8.0)

## 2023-10-16 LAB — COMPREHENSIVE METABOLIC PANEL WITH GFR
ALT: 21 U/L (ref 0–44)
AST: 19 U/L (ref 15–41)
Albumin: 4.1 g/dL (ref 3.5–5.0)
Alkaline Phosphatase: 62 U/L (ref 38–126)
Anion gap: 10 (ref 5–15)
BUN: 14 mg/dL (ref 6–20)
CO2: 26 mmol/L (ref 22–32)
Calcium: 9 mg/dL (ref 8.9–10.3)
Chloride: 104 mmol/L (ref 98–111)
Creatinine, Ser: 0.96 mg/dL (ref 0.61–1.24)
GFR, Estimated: >60 mL/min (ref >60)
Glucose, Bld: 109 mg/dL — ABNORMAL HIGH (ref 70–99)
Potassium: 3.9 mmol/L (ref 3.5–5.1)
Sodium: 140 mmol/L (ref 135–145)
Total Bilirubin: 0.5 mg/dL (ref 0.0–1.2)
Total Protein: 7.3 g/dL (ref 6.5–8.1)

## 2023-10-16 LAB — CBC WITH DIFFERENTIAL/PLATELET
Abs Immature Granulocytes: 0.01 10*3/uL (ref 0.00–0.07)
Basophils Absolute: 0 10*3/uL (ref 0.0–0.1)
Basophils Relative: 1 %
Eosinophils Absolute: 0.5 10*3/uL (ref 0.0–0.5)
Eosinophils Relative: 8 %
HCT: 40.8 % (ref 39.0–52.0)
Hemoglobin: 14.2 g/dL (ref 13.0–17.0)
Immature Granulocytes: 0 %
Lymphocytes Relative: 39 %
Lymphs Abs: 2.5 10*3/uL (ref 0.7–4.0)
MCH: 30.9 pg (ref 26.0–34.0)
MCHC: 34.8 g/dL (ref 30.0–36.0)
MCV: 88.7 fL (ref 80.0–100.0)
Monocytes Absolute: 0.5 10*3/uL (ref 0.1–1.0)
Monocytes Relative: 9 %
Neutro Abs: 2.8 10*3/uL (ref 1.7–7.7)
Neutrophils Relative %: 43 %
Platelets: 306 10*3/uL (ref 150–400)
RBC: 4.6 MIL/uL (ref 4.22–5.81)
RDW: 13.4 % (ref 11.5–15.5)
WBC: 6.4 10*3/uL (ref 4.0–10.5)
nRBC: 0 % (ref 0.0–0.2)

## 2023-10-16 LAB — TYPE AND SCREEN
ABO/RH(D): O NEG
Antibody Screen: NEGATIVE

## 2023-10-16 LAB — HIV ANTIBODY (ROUTINE TESTING W REFLEX): HIV Screen 4th Generation wRfx: NONREACTIVE

## 2023-10-16 LAB — HEMOGLOBIN AND HEMATOCRIT, BLOOD
HCT: 38.3 % — ABNORMAL LOW (ref 39.0–52.0)
HCT: 35.9 % — ABNORMAL LOW (ref 39.0–52.0)
Hemoglobin: 13.2 g/dL (ref 13.0–17.0)
Hemoglobin: 12.5 g/dL — ABNORMAL LOW (ref 13.0–17.0)

## 2023-10-16 LAB — LIPASE, BLOOD: Lipase: 25 U/L (ref 11–51)

## 2023-10-16 MED ORDER — ONDANSETRON HCL 4 MG/2ML IJ SOLN
4.0000 mg | Freq: Once | INTRAMUSCULAR | Status: AC
  Administered 2023-10-16: 4 mg via INTRAVENOUS
  Filled 2023-10-16: qty 2

## 2023-10-16 MED ORDER — ONDANSETRON HCL 4 MG PO TABS: 4.0000 mg | ORAL_TABLET | Freq: Four times a day (QID) | ORAL | Status: DC | PRN

## 2023-10-16 MED ORDER — SODIUM CHLORIDE 0.9 % IV BOLUS
1000.0000 mL | Freq: Once | INTRAVENOUS | Status: AC
  Administered 2023-10-16: 1000 mL via INTRAVENOUS

## 2023-10-16 MED ORDER — PEG 3350-KCL-NA BICARB-NACL 420 G PO SOLR
4000.0000 mL | Freq: Once | ORAL | Status: AC
  Administered 2023-10-16: 4000 mL via ORAL
  Filled 2023-10-16: qty 4000

## 2023-10-16 MED ORDER — ACETAMINOPHEN 325 MG PO TABS
650.0000 mg | ORAL_TABLET | Freq: Four times a day (QID) | ORAL | Status: DC | PRN
Start: 2023-10-16 — End: 2023-10-20
  Administered 2023-10-18 – 2023-10-19 (×2): 650 mg via ORAL
  Filled 2023-10-16 (×2): qty 2

## 2023-10-16 MED ORDER — IOHEXOL 350 MG/ML SOLN
100.0000 mL | Freq: Once | INTRAVENOUS | Status: AC | PRN
  Administered 2023-10-16: 100 mL via INTRAVENOUS

## 2023-10-16 MED ORDER — ACETAMINOPHEN 500 MG PO TABS
1000.0000 mg | ORAL_TABLET | Freq: Once | ORAL | Status: AC
  Administered 2023-10-16: 1000 mg via ORAL
  Filled 2023-10-16: qty 2

## 2023-10-16 MED ORDER — ACETAMINOPHEN 650 MG RE SUPP: 650.0000 mg | Freq: Four times a day (QID) | RECTAL | Status: DC | PRN

## 2023-10-16 MED ORDER — BISACODYL 5 MG PO TBEC
10.0000 mg | DELAYED_RELEASE_TABLET | Freq: Once | ORAL | Status: AC
  Administered 2023-10-16: 10 mg via ORAL
  Filled 2023-10-16: qty 2

## 2023-10-16 MED ORDER — ONDANSETRON HCL 4 MG/2ML IJ SOLN: 4.0000 mg | Freq: Four times a day (QID) | INTRAMUSCULAR | Status: DC | PRN

## 2023-10-16 MED ORDER — PANTOPRAZOLE SODIUM 40 MG IV SOLR
40.0000 mg | Freq: Once | INTRAVENOUS | Status: AC
  Administered 2023-10-16: 40 mg via INTRAVENOUS
  Filled 2023-10-16: qty 10

## 2023-10-16 MED ORDER — MORPHINE SULFATE (PF) 4 MG/ML IV SOLN: 4.0000 mg | Freq: Once | INTRAVENOUS | Status: DC

## 2023-10-16 NOTE — ED Notes (Signed)
 Pt had a large bright red BM episode in br per patient and officer at bedside.

## 2023-10-16 NOTE — Plan of Care (Signed)

## 2023-10-16 NOTE — Consult Note (Signed)
 Gastroenterology Consult   Referring Provider: No ref. provider found Primary Care Physician:  Patient, No Pcp Per Primary Gastroenterologist: Muhammad Faizan Ahmed, MD (previously unassigned)  Patient ID: William Francis; [id number]; 08/09/1970   Admit date: 10/16/2023  LOS: 0 days   Date of Consultation: 10/16/2023  Reason for Consultation:  Hematochezia  History of Present Illness   William Francis is a 53 y.o. year old male with no significant documented medical history who presented to the ED today with complaint of periumbilical pain and hematochezia that began this morning also associated with some nausea without vomiting.  Per EDP note he has experienced this before in the past.  GI consulted for further evaluation  ED course: - Mildly tachycardic on arrival 106 but has resolved.  Intermittent hypertension with diastolic >100.  Afebrile. - Labs: Hemoglobin stable at 14.2.  CMP and lipase unremarkable other than mildly elevated glucose. - Stool heme + - CTA A/P with active GI bleeding in the sigmoid colon characterized by active extravasation on both arterial and venous phase.  Mild colonic wall thickening adjacent to the area of bleeding with normal small bowel.  Questionable previous appendectomy.  No significant colonic diverticula.  Mild prominent lymph node or lymph nodes near the sigmoid colon wall thickening.  Focal low-density area in the central aspect of the prostate with question of previous intervention in this area.  Right inguinal fat-containing hernia.  Small umbilical fat-containing hernia.  Small hypodensities in the liver likely hepatic cyst too small to characterize.  Patent IMA SMA.  Mild narrowing of the proximal celiac trunk suggestive for median arcuate ligament compression with main branches of the celiac trunk patent.   Consult:  Denies any medical or surgical history.  Does not take any medications on a regular basis, has not had ibuprofen  in 1 week.  Denies  any alcohol use, tobacco use, or illicit drug use.  53 year old male with lower abdominal pain this morning and had 3 episodes of rectal bleeding prior to arrival to the ED and has had 2 episodes since has been here.  He states these episodes have filled up the toilet bowl.  In the past he has not had intermittent toilet tissue hematochezia that occurs about every other month.  Denies any constipation, diarrhea or lack of appetite, early satiety.  He does report about 8 pound weight loss for 1 week, likely secondary to decreased p.o. intake given the meals provided.  No blood thinners.  Family history of colon cancer or colon polyps that he is aware of.   History reviewed. No pertinent past medical history.  History reviewed. No pertinent surgical history.  Prior to Admission medications   Not on File    No current facility-administered medications for this encounter.   No current outpatient medications on file.    Allergies as of 10/16/2023 - Review Complete 10/16/2023  Allergen Reaction Noted   Penicillins Anaphylaxis 04/20/2013    History reviewed. No pertinent family history.  Social History   Socioeconomic History   Marital status: Single    Spouse name: Not on file   Number of children: Not on file   Years of education: Not on file   Highest education level: Not on file  Occupational History   Not on file  Tobacco Use   Smoking status: Never    Passive exposure: Never   Smokeless tobacco: Not on file  Vaping Use   Vaping status: Never Used  Substance and Sexual Activity  Alcohol use: No   Drug use: No   Sexual activity: Not Currently  Other Topics Concern   Not on file  Social History Narrative   Not on file   Social Drivers of Health   Financial Resource Strain: Not on file  Food Insecurity: Not on file  Transportation Needs: Not on file  Physical Activity: Not on file  Stress: Not on file  Social Connections: Not on file  Intimate Partner Violence: Not  on file     Review of Systems   Gen: Denies any fever, chills, loss of appetite, change in weight or weight loss CV: Denies chest pain, heart palpitations, syncope, edema  Resp: Denies shortness of breath with rest, cough, wheezing, coughing up blood, and pleurisy. GI: see HPI GU : Denies urinary burning, blood in urine, urinary frequency, and urinary incontinence. MS: Denies joint pain, limitation of movement, swelling, cramps, and atrophy.  Derm: Denies rash, itching, dry skin, hives. Psych: Denies depression, anxiety, memory loss, hallucinations, and confusion. Heme: Denies bruising or bleeding Neuro:  Denies any headaches, dizziness, paresthesias, shaking  Physical Exam   Vital Signs in last 24 hours: Temp:  [98.6 F (37 C)] 98.6 F (37 C) (06/26 1422) Pulse Rate:  [77-106] 77 (06/26 1230) Resp:  [13-34] 13 (06/26 1400) BP: (125-177)/(68-109) 129/99 (06/26 1400) SpO2:  [94 %-97 %] 97 % (06/26 1230) Weight:  [99 kg] 99 kg (06/26 1014)    General: Alert, Well-developed, well-nourished, pleasant and cooperative in NAD Head:  Normocephalic and atraumatic. Eyes:  Sclera clear, no icterus.   Conjunctiva pink. Ears:  Normal auditory acuity. Mouth:  No deformity or lesions, dentition normal. Neck:  Supple; no masses Lungs:  Clear throughout to auscultation.   No wheezes, crackles, or rhonchi. No acute distress.  Heart:  Regular rate and rhythm; no murmurs, clicks, rubs,  or gallops. Abdomen:  Soft, non-distended. Mild TTP to mid lower abdomen. No masses, hepatosplenomegaly or hernias noted. Normal bowel sounds, without guarding, and without rebound.   Rectal: deferred Msk:  Symmetrical without gross deformities. Normal posture. Extremities:  Without clubbing or edema. Neurologic:  Alert and  oriented x4. Skin:  Intact without significant lesions or rashes. Numerous tattoos Psych:  Alert and cooperative. Normal mood and affect.  Intake/Output from previous day: No  intake/output data recorded. Intake/Output this shift: No intake/output data recorded.  Wt Readings from Last 3 Encounters:  10/16/23 99 kg  04/20/13 99.8 kg   Labs/Studies   Recent Labs Recent Labs    10/16/23 1034  WBC 6.4  HGB 14.2  HCT 40.8  PLT 306   BMET Recent Labs    10/16/23 1034  NA 140  K 3.9  CL 104  CO2 26  GLUCOSE 109*  BUN 14  CREATININE 0.96  CALCIUM 9.0   LFT Recent Labs    10/16/23 1034  PROT 7.3  ALBUMIN 4.1  AST 19  ALT 21  ALKPHOS 62  BILITOT 0.5   PT/INR No results for input(s): LABPROT, INR in the last 72 hours. Hepatitis Panel No results for input(s): HEPBSAG, HCVAB, HEPAIGM, HEPBIGM in the last 72 hours. C-Diff No results for input(s): CDIFFTOX in the last 72 hours.  Radiology/Studies CT Angio Abd/Pel W and/or Wo Contrast Result Date: 10/16/2023 CLINICAL DATA:  Lower GI bleed.  Hematochezia. EXAM: CTA ABDOMEN AND PELVIS WITHOUT AND WITH CONTRAST TECHNIQUE: Multidetector CT imaging of the abdomen and pelvis was performed using the standard protocol during bolus administration of intravenous contrast. Multiplanar reconstructed images  and MIPs were obtained and reviewed to evaluate the vascular anatomy. RADIATION DOSE REDUCTION: This exam was performed according to the departmental dose-optimization program which includes automated exposure control, adjustment of the mA and/or kV according to patient size and/or use of iterative reconstruction technique. CONTRAST:  OMNIPAQUE  IOHEXOL  350 MG/ML SOLN COMPARISON:  CT abdomen pelvis 04/20/2013 FINDINGS: VASCULAR Aorta: Normal caliber aorta without aneurysm, dissection, vasculitis or significant stenosis. Celiac: Mild narrowing of the proximal celiac trunk and the configuration is suggestive for median arcuate ligament compression. Main branches of the celiac trunk are patent. No evidence for aneurysm or dissection. SMA: Patent without evidence of aneurysm, dissection, vasculitis  or significant stenosis. Renals: Both renal arteries are patent without evidence of aneurysm, dissection, vasculitis, fibromuscular dysplasia or significant stenosis. IMA: Patent without evidence of aneurysm, dissection, vasculitis or significant stenosis. Inflow: Patent without evidence of aneurysm, dissection, vasculitis or significant stenosis. Proximal Outflow: Proximal femoral arteries are patent bilaterally. Veins: IVC and iliac veins are patent. Renal veins are patent. Main portal venous system is patent. Prominent venous structures in the pelvis. Review of the MIP images confirms the above findings. NON-VASCULAR Lower chest: Mild motion artifact at the lung bases. No significant consolidation at the lung bases. No pleural effusions. Hepatobiliary: Small hypodensities in liver likely represent hepatic cysts but some are too small to definitively characterize. Normal appearance of the gallbladder. No biliary dilatation. Pancreas: Unremarkable. No pancreatic ductal dilatation or surrounding inflammatory changes. Spleen: Normal in size without focal abnormality. Adrenals/Urinary Tract: Normal adrenal glands. Negative for kidney stones. No hydronephrosis. Normal urinary bladder. No suspicious renal lesions. Stomach/Bowel: Normal stomach. Evidence for contrast extravasation on both the arterial and venous phase imaging in the sigmoid colon located in the right lower abdomen. This finding is best seen on image 163/10. There is mild colonic wall thickening adjacent to the area of bleeding, best seen on the venous phase imaging, image 66, sequence 16. Normal appearance of the small bowel. Question previous appendectomy. No significant colonic diverticula. Lymphatic: No significant lymph node enlargement in the abdomen and pelvis but there is a mildly prominent lymph node or lymph nodes near the sigmoid colon wall thickening and bleeding on image 72/19 and image 89/20. Reproductive: Focal low-density area in the  central aspect of the prostate on image 93/16 and question previous intervention in this area such as the TURP. Prostate size appears to be within normal limits. Other: Right inguinal hernia containing fat. Negative for free fluid. Small umbilical hernia containing fat. Musculoskeletal: No acute bone abnormality. No suspicious osseous lesions. IMPRESSION: 1. Positive for active GI bleeding in the sigmoid colon. 2. Mild colonic wall thickening adjacent to the area of bleeding. GI bleeding etiology is uncertain. Although this could be associated with a diverticular bleed, cannot exclude an underlying colonic neoplasm. In addition, there is a mildly prominent lymph node near the sigmoid colon that is nonspecific. 3. Small hypodense structures in liver probably represent hepatic cysts. However, depending on the etiology for the GI bleeding, this Fabre need further characterization. 4. Mild narrowing of the proximal celiac trunk and the configuration is suggestive for median arcuate ligament compression. 5. Right inguinal hernia containing fat. Small umbilical hernia containing fat. 6. Focal low-density area in the prostate and question previous surgical intervention in this area. These results were called by telephone at the time of interpretation on 10/16/2023 at 12:47 pm to provider CHRISTOPHER RIGNEY , who verbally acknowledged these results. Electronically Signed   By: Juliene Philip HERO.D.  On: 10/16/2023 12:49     Assessment   William Francis is a 53 y.o. year old male with no medical or surgical history who presented to the ED with abdominal pain and hematochezia with stable hgb. GI consulted given concern for sigmoid wall thickening and active extravasation of contrast in the sigmoid.   Hematochezia, Active sigmoid GI bleed: - Patient presented with periumbilical abdominal pain and hematochezia (total 5 episodes today) - CTA of the abdomen pelvis with evidence of active GI bleeding within the sigmoid colon with  associated wall thickening and some prominent lymph nodes in this area.  Cannot rule out ischemia, diverticula, versus colonic neoplasm - No prior history of colonoscopy. - Hemoglobin stable at this point despite multiple episodes of hematochezia - Vital signs stable at this point. - Given he is hemodynamically stable and hemoglobin is stable, will plan for colonoscopy tomorrow for further evaluation with possible therapeutic intervention if possible. - If he were to become hemodynamically unstable he would likely need transfer for IR intervention - If unable to provide intervention during colonoscopy, will likely need to consult IR.  Plan / Recommendations   Trend H/H, transfuse Hgb <7, q6-8h checks Clear liquids N.p.o. midnight Plan for colonoscopy tomorrow with Dr. Eartha If he becomes hemodynamically unstable, will need to consider transfer and IR consult.     10/16/2023, 3:38 PM  Charmaine Melia, MSN, FNP-BC, AGACNP-BC [facility]

## 2023-10-16 NOTE — H&P (Signed)
 History and Physical    Patient: William Francis FMW:[id number] DOB: 05-31-1970 DOA: 10/16/2023 DOS: the patient was seen and examined on 10/16/2023 PCP: Patient, No Pcp Per  Patient coming from: [facility]  Chief Complaint:  Chief Complaint  Patient presents with   Abdominal Pain    hematochezia   HPI: William Francis is a 53 year old with no documented chronic medical issues presented from [facility] facility with hematochezia.  The patient had 3 episodes prior to arrival in the morning of 10/16/2023.  He had some dizziness and nausea with abdominal cramping.  He states that he had noticed some small amounts blood in his stool on 10/15/2023. The patient states that he has had some intermittent hematochezia for at least the last 2 years for which she has not sought any medical care.  However, he states that this episode has been a greater volume.  Denies alcohol, illicit drug use, NSAIDs.SABRA He denies any fevers, chills, chest pain, shortness of breath, coughing hemoptysis.  He denies any dysuria or hematuria.  He denies any melena.  Patient was afebrile with stable with oxygen saturation 97% room air.  WBC 6.4, hemoglobin 14.2, platelets 306.  Sodium 140, potassium 3.9, bicarbonate 26, serum creatinine 0.96.  CTA abd/pelvis showed active GI bleed in the sigmoid colon.  There was mild colonic wall thickening adjacent to the area of the bleeding.  With concern for diverticular bleed versus neoplasm.  There was some mild narrowing of the proximal celiac trunk. EDP spoke with Ahmed  who agrees to consult.  Review of Systems: As mentioned in the history of present illness. All other systems reviewed and are negative. History reviewed. No pertinent past medical history. History reviewed. No pertinent surgical history. Social History:  reports that he has never smoked. He has never been exposed to tobacco smoke. He does not have any smokeless tobacco history on file. He reports that  he does not drink alcohol and does not use drugs.  Allergies  Allergen Reactions   Penicillins Anaphylaxis    History reviewed. No pertinent family history.  Prior to Admission medications   Not on File    Physical Exam: Vitals:   10/16/23 1130 10/16/23 1200 10/16/23 1215 10/16/23 1230  BP: (!) 136/97 (!) 149/108 (!) 139/109 (!) 151/109  Pulse: 77 82 85 77  Resp: 19 14 14 16   Temp:      SpO2: 94% 96% 97% 97%  Weight:      Height:       GENERAL:  A&O x 3, NAD, well developed, cooperative, follows commands HEENT: [place]/AT, No thrush, No icterus, No oral ulcers Neck:  No neck mass, No meningismus, soft, supple CV: RRR, no S3, no S4, no rub, no JVD Lungs:  CTA, no wheeze, no rhonchi, good air movement Abd: soft/NT +BS, nondistended Ext: No edema, no lymphangitis, no cyanosis, no rashes Neuro:  CN II-XII intact, strength 4/5 in RUE, RLE, strength 4/5 LUE, LLE; sensation intact bilateral; no dysmetria; babinski equivocal  Data Reviewed: Data reviewed above in history  Assessment and Plan: Hematochezia/Lower GI bleed -GI consulted>>tentatively plans for colonoscopy 6/27 - Type and screen - Monitor serial H&H - Clear liquid diet  Elevated blood pressure - No history of hypertension - Monitor for now    Advance Care Planning: FULL  Consults: GI  Family Communication: none  Severity of Illness: The appropriate patient status for this patient is OBSERVATION. Observation status is judged to be reasonable and necessary in order to  provide the required intensity of service to ensure the patient's safety. The patient's presenting symptoms, physical exam findings, and initial radiographic and laboratory data in the context of their medical condition is felt to place them at decreased risk for further clinical deterioration. Furthermore, it is anticipated that the patient will be medically stable for discharge from the hospital within 2 midnights of admission.   Author: Alm Schneider, MD 10/16/2023 2:04 PM  For on call review [email or web address].

## 2023-10-16 NOTE — Hospital Course (Signed)
 53 year old with no documented chronic medical issues presented from [facility] facility with hematochezia.  The patient had 3 episodes prior to arrival in the morning of 10/16/2023.  He had some dizziness and nausea with abdominal cramping.  He states that he had noticed some small amounts blood in his stool on 10/15/2023. The patient states that he has had some intermittent hematochezia for at least the last 2 years for which she has not sought any medical care.  However, he states that this episode has been a greater volume.  Denies alcohol, illicit drug use, NSAIDs.SABRA He denies any fevers, chills, chest pain, shortness of breath, coughing hemoptysis.  He denies any dysuria or hematuria.  He denies any melena.  Patient was afebrile with stable with oxygen saturation 97% room air.  WBC 6.4, hemoglobin 14.2, platelets 306.  Sodium 140, potassium 3.9, bicarbonate 26, serum creatinine 0.96.  CTA abd/pelvis showed active GI bleed in the sigmoid colon.  There was mild colonic wall thickening adjacent to the area of the bleeding.  With concern for diverticular bleed versus neoplasm.  There was some mild narrowing of the proximal celiac trunk. EDP spoke with Ahmed  who agrees to consult.

## 2023-10-16 NOTE — ED Provider Notes (Signed)
 [name] Provider Note   CSN: [id number] Arrival date & time: 10/16/23  1005     Patient presents with: Abdominal Pain (hematochezia)   William Francis is a 53 y.o. male.   Patient is a 53 year old male who presents to the emergency department with a chief complaint of periumbilical pain, hematochezia which has been ongoing since this morning.  He notes that this has occurred previously and was evaluated in the emergency department at that time.  Patient currently denies any associated dizziness, lightheadedness, syncope.  He denies any chest pain or shortness of breath.  He denies any dysuria or hematuria.  He has had some associated nausea without vomiting.   Abdominal Pain      Prior to Admission medications   Medication Sig Start Date End Date Taking? Authorizing Provider  clindamycin  (CLEOCIN ) 300 MG capsule Take 1 capsule (300 mg total) by mouth 3 (three) times daily. 09/29/14   Lynwood Anes, MD  HYDROcodone -acetaminophen  (NORCO/VICODIN) 5-325 MG per tablet Take 1 tablet by mouth every 4 (four) hours as needed. 09/29/14   Lynwood Anes, MD    Allergies: Penicillins    Review of Systems  Gastrointestinal:  Positive for abdominal pain and blood in stool.  All other systems reviewed and are negative.   Updated Vital Signs BP 125/68   Pulse 98   Temp 98.6 F (37 C)   Resp 18   Ht 6' 1 (1.854 m)   Wt 99 kg   SpO2 95%   BMI 28.80 kg/m   Physical Exam Vitals and nursing note reviewed.  Constitutional:      Appearance: Normal appearance.  HENT:     Head: Normocephalic and atraumatic.     Nose: Nose normal.     Mouth/Throat:     Mouth: Mucous membranes are moist.   Eyes:     Extraocular Movements: Extraocular movements intact.     Conjunctiva/sclera: Conjunctivae normal.     Pupils: Pupils are equal, round, and reactive to light.    Cardiovascular:     Rate and Rhythm: Normal rate and regular rhythm.     Pulses:  Normal pulses.     Heart sounds: Normal heart sounds. No murmur heard.    No gallop.  Pulmonary:     Effort: Pulmonary effort is normal. No respiratory distress.     Breath sounds: Normal breath sounds. No stridor. No wheezing, rhonchi or rales.  Abdominal:     General: Abdomen is flat. Bowel sounds are normal. There is no distension. There are no signs of injury.     Palpations: Abdomen is soft.     Tenderness: There is abdominal tenderness in the epigastric area and periumbilical area. Negative signs include Murphy's sign and McBurney's sign.     Hernia: No hernia is present.   Musculoskeletal:        General: Normal range of motion.     Cervical back: Normal range of motion and neck supple.   Skin:    General: Skin is warm and dry.     Findings: No rash.   Neurological:     General: No focal deficit present.     Mental Status: He is alert and oriented to person, place, and time. Mental status is at baseline.   Psychiatric:        Mood and Affect: Mood normal.        Behavior: Behavior normal.        Thought Content: Thought  content normal.        Judgment: Judgment normal.     (all labs ordered are listed, but only abnormal results are displayed) Labs Reviewed  URINALYSIS, ROUTINE W REFLEX MICROSCOPIC - Abnormal; Notable for the following components:      Result Value   Hgb urine dipstick SMALL (*)    All other components within normal limits  POC OCCULT BLOOD, ED - Abnormal  LIPASE, BLOOD  COMPREHENSIVE METABOLIC PANEL WITH GFR  CBC WITH DIFFERENTIAL/PLATELET  TYPE AND SCREEN    EKG: EKG Interpretation Date/Time:  Thursday October 16 2023 10:24:33 EDT Ventricular Rate:  109 PR Interval:  163 QRS Duration:  94 QT Interval:  333 QTC Calculation: 449 R Axis:   36  Text Interpretation: Sinus tachycardia Low voltage, precordial leads Probable anteroseptal infarct, old No old tracing to compare Confirmed by Towana Sharper [id number]) on 10/16/2023 10:26:21  AM  Radiology: No results found.   Procedures   Medications Ordered in the ED  sodium chloride 0.9 % bolus 1,000 mL (has no administration in time range)  ondansetron  (ZOFRAN ) injection 4 mg (4 mg Intravenous Given 10/16/23 1048)    Clinical Course as of 10/16/23 1416  Thu Oct 16, 2023  1018 POC occult blood, ED RN will collect(!) [CR]  [id number] 53 year old male here with painless rectal bleeding.  He said he said a few episodes in the past never been worked up.  Hemoglobin and blood pressure have been okay.  CT showing active bleeding.  GI recommending admission for anticipated colonoscopy. [MB]    Clinical Course User Index [CR] William Lonni BIRCH, PA-C [MB] Towana Sharper BROCKS, MD                                 Medical Decision Making Amount and/or Complexity of Data Reviewed Labs: ordered. Decision-making details documented in ED Course. Radiology: ordered.  Risk OTC drugs. Prescription drug management. Decision regarding hospitalization.   This patient presents to the ED for concern of GI bleed, abdominal pain, this involves an extensive number of treatment options, and is a complaint that carries with it a high risk of complications and morbidity.  The differential diagnosis includes upper GI bleed, lower GI bleed, diverticulitis, neoplasm   Co morbidities that complicate the patient evaluation  None   Additional history obtained:  Additional history obtained from none External records from outside source obtained and reviewed including none   Lab Tests:  I Ordered, and personally interpreted labs.  The pertinent results include: No leukocytosis, no anemia, normal electrolytes, normal kidney function liver function, unremarkable urinalysis, negative lipase, positive Hemoccult stool   Imaging Studies ordered:  I ordered imaging studies including CTA of the abdomen and pelvis I independently visualized and interpreted imaging which showed acute bleeding  within the sigmoid colon I agree with the radiologist interpretation   Cardiac Monitoring: / EKG:  The patient was maintained on a cardiac monitor.  I personally viewed and interpreted the cardiac monitored which showed an underlying rhythm of: Sinus tachycardia, no ST/T wave changes, no ischemic changes, no STEMI   Consultations Obtained:  I requested consultation with the gastroenterology, Dr. Cinderella,  and discussed lab and imaging findings as well as pertinent plan - they recommend: Admission, GI prep, will see patient in consult   Problem List / ED Course / Critical interventions / Medication management  Patient is doing well at this time and does remain stable.  Discussed with patient that we will plan for admission to the hospital service.  Did discuss patient case with the gastroenterologist on-call Dr. Cinderella who does note that he will see the patient in consult and recommend scoping tomorrow.  He does recommend n.p.o. at this time.  Patient continues have stable vital signs with no hypotension or tachycardia.  H&H is stable at this time.  Patient's pain is well-controlled at this time as well.  Have discussed patient case with Dr. Evonnie with the hospitalist service who has excepted for admission. I ordered medication including Protonix, Zofran , IV fluids, Tylenol  for abdominal pain, nausea Reevaluation of the patient after these medicines showed that the patient improved I have reviewed the patients home medicines and have made adjustments as needed   Social Determinants of Health:  None   Test / Admission - Considered:  Admission     Final diagnoses:  None    ED Discharge Orders     None          William Francis 10/16/23 1419    Towana Ozell BROCKS, MD 10/16/23 1734

## 2023-10-16 NOTE — ED Notes (Signed)
 Fecal Occult Blood Card Positive.

## 2023-10-16 NOTE — ED Triage Notes (Signed)
 Pt arrived via [facility] in private custody for evaluation of 3 episodes of hematochezia and abdominal tenderness. Pt reports this occurred once before in 2014.

## 2023-10-17 ENCOUNTER — Observation Stay (HOSPITAL_COMMUNITY): Admitting: Certified Registered"

## 2023-10-17 ENCOUNTER — Inpatient Hospital Stay (HOSPITAL_COMMUNITY)

## 2023-10-17 ENCOUNTER — Encounter (HOSPITAL_COMMUNITY): Payer: Self-pay | Admitting: Internal Medicine

## 2023-10-17 ENCOUNTER — Encounter (HOSPITAL_COMMUNITY): Admission: EM | Payer: Self-pay | Attending: Internal Medicine

## 2023-10-17 DIAGNOSIS — K6389 Other specified diseases of intestine: Secondary | ICD-10-CM | POA: Diagnosis not present

## 2023-10-17 DIAGNOSIS — K649 Unspecified hemorrhoids: Secondary | ICD-10-CM | POA: Diagnosis not present

## 2023-10-17 DIAGNOSIS — Z87891 Personal history of nicotine dependence: Secondary | ICD-10-CM | POA: Diagnosis not present

## 2023-10-17 DIAGNOSIS — K7689 Other specified diseases of liver: Secondary | ICD-10-CM | POA: Diagnosis present

## 2023-10-17 DIAGNOSIS — K922 Gastrointestinal hemorrhage, unspecified: Secondary | ICD-10-CM

## 2023-10-17 DIAGNOSIS — C189 Malignant neoplasm of colon, unspecified: Secondary | ICD-10-CM | POA: Diagnosis not present

## 2023-10-17 DIAGNOSIS — R030 Elevated blood-pressure reading, without diagnosis of hypertension: Secondary | ICD-10-CM | POA: Diagnosis present

## 2023-10-17 DIAGNOSIS — K921 Melena: Secondary | ICD-10-CM | POA: Diagnosis present

## 2023-10-17 DIAGNOSIS — C187 Malignant neoplasm of sigmoid colon: Secondary | ICD-10-CM | POA: Diagnosis present

## 2023-10-17 DIAGNOSIS — Z880 Allergy status to penicillin: Secondary | ICD-10-CM | POA: Diagnosis not present

## 2023-10-17 DIAGNOSIS — K648 Other hemorrhoids: Secondary | ICD-10-CM | POA: Diagnosis present

## 2023-10-17 DIAGNOSIS — C772 Secondary and unspecified malignant neoplasm of intra-abdominal lymph nodes: Secondary | ICD-10-CM | POA: Diagnosis present

## 2023-10-17 DIAGNOSIS — K625 Hemorrhage of anus and rectum: Secondary | ICD-10-CM

## 2023-10-17 DIAGNOSIS — D62 Acute posthemorrhagic anemia: Secondary | ICD-10-CM | POA: Diagnosis present

## 2023-10-17 DIAGNOSIS — R739 Hyperglycemia, unspecified: Secondary | ICD-10-CM | POA: Diagnosis present

## 2023-10-17 HISTORY — PX: COLONOSCOPY: SHX5424

## 2023-10-17 LAB — HEMOGLOBIN AND HEMATOCRIT, BLOOD
HCT: 33.8 % — ABNORMAL LOW (ref 39.0–52.0)
HCT: 34 % — ABNORMAL LOW (ref 39.0–52.0)
HCT: 31.5 % — ABNORMAL LOW (ref 39.0–52.0)
Hemoglobin: 11.7 g/dL — ABNORMAL LOW (ref 13.0–17.0)
Hemoglobin: 11.8 g/dL — ABNORMAL LOW (ref 13.0–17.0)
Hemoglobin: 10.9 g/dL — ABNORMAL LOW (ref 13.0–17.0)

## 2023-10-17 LAB — PROTIME-INR
INR: 1.1 (ref 0.8–1.2)
Prothrombin Time: 14.4 s (ref 11.4–15.2)

## 2023-10-17 LAB — BASIC METABOLIC PANEL WITH GFR
Anion gap: 6 (ref 5–15)
BUN: 12 mg/dL (ref 6–20)
CO2: 29 mmol/L (ref 22–32)
Calcium: 8.6 mg/dL — ABNORMAL LOW (ref 8.9–10.3)
Chloride: 104 mmol/L (ref 98–111)
Creatinine, Ser: 0.94 mg/dL (ref 0.61–1.24)
GFR, Estimated: >60 mL/min (ref >60)
Glucose, Bld: 112 mg/dL — ABNORMAL HIGH (ref 70–99)
Potassium: 3.6 mmol/L (ref 3.5–5.1)
Sodium: 139 mmol/L (ref 135–145)

## 2023-10-17 SURGERY — COLONOSCOPY
Anesthesia: General

## 2023-10-17 MED ORDER — EPHEDRINE SULFATE (PRESSORS) 50 MG/ML IJ SOLN
INTRAMUSCULAR | Status: DC | PRN
  Administered 2023-10-17 (×2): 10 mg via INTRAVENOUS

## 2023-10-17 MED ORDER — PHENYLEPHRINE 80 MCG/ML (10ML) SYRINGE FOR IV PUSH (FOR BLOOD PRESSURE SUPPORT)
PREFILLED_SYRINGE | INTRAVENOUS | Status: DC | PRN
Start: 2023-10-17 — End: 2023-10-17
  Administered 2023-10-17: 80 ug via INTRAVENOUS
  Administered 2023-10-17: 120 ug via INTRAVENOUS

## 2023-10-17 MED ORDER — SPOT INK MARKER SYRINGE KIT
PACK | SUBMUCOSAL | Status: DC | PRN
  Administered 2023-10-17: 3 mL via SUBMUCOSAL

## 2023-10-17 MED ORDER — PHENYLEPHRINE 80 MCG/ML (10ML) SYRINGE FOR IV PUSH (FOR BLOOD PRESSURE SUPPORT)
PREFILLED_SYRINGE | INTRAVENOUS | Status: AC
  Filled 2023-10-17: qty 10

## 2023-10-17 MED ORDER — IOHEXOL 300 MG/ML  SOLN
75.0000 mL | Freq: Once | INTRAMUSCULAR | Status: AC | PRN
Start: 2023-10-17 — End: 2023-10-17
  Administered 2023-10-17: 75 mL via INTRAVENOUS

## 2023-10-17 MED ORDER — DEXMEDETOMIDINE HCL IN NACL 80 MCG/20ML IV SOLN
INTRAVENOUS | Status: DC | PRN
  Administered 2023-10-17: 80 ug via INTRAVENOUS
  Administered 2023-10-17: 8 ug via INTRAVENOUS
  Administered 2023-10-17: 80 ug via INTRAVENOUS
  Administered 2023-10-17: 8 ug via INTRAVENOUS

## 2023-10-17 MED ORDER — SODIUM CHLORIDE 0.9 % IV SOLN: INTRAVENOUS | Status: DC

## 2023-10-17 MED ORDER — PROPOFOL 500 MG/50ML IV EMUL
INTRAVENOUS | Status: DC | PRN
  Administered 2023-10-17: 150 ug/kg/min via INTRAVENOUS

## 2023-10-17 MED ORDER — PROPOFOL 10 MG/ML IV BOLUS
INTRAVENOUS | Status: DC | PRN
  Administered 2023-10-17: 50 mg via INTRAVENOUS

## 2023-10-17 MED ORDER — PHENYLEPHRINE HCL (PRESSORS) 10 MG/ML IV SOLN
INTRAVENOUS | Status: DC | PRN
Start: 2023-10-17 — End: 2023-10-17
  Administered 2023-10-17: 80 ug via INTRAVENOUS

## 2023-10-17 MED ORDER — LACTATED RINGERS IV SOLN: INTRAVENOUS | Status: DC | PRN

## 2023-10-17 MED ORDER — LIDOCAINE 2% (20 MG/ML) 5 ML SYRINGE
INTRAMUSCULAR | Status: AC
  Filled 2023-10-17: qty 5

## 2023-10-17 NOTE — Anesthesia Preprocedure Evaluation (Signed)
 Anesthesia Evaluation  Patient identified by MRN, date of birth, ID band Patient awake    Reviewed: Allergy & Precautions, H&P , NPO status , Patient's Chart, lab work & pertinent test results, reviewed documented beta blocker date and time   Airway Mallampati: II  TM Distance: >3 FB Neck ROM: full    Dental no notable dental hx.    Pulmonary neg pulmonary ROS   Pulmonary exam normal breath sounds clear to auscultation       Cardiovascular Exercise Tolerance: Good hypertension, negative cardio ROS  Rhythm:regular Rate:Normal     Neuro/Psych negative neurological ROS  negative psych ROS   GI/Hepatic negative GI ROS, Neg liver ROS,,,  Endo/Other  negative endocrine ROS    Renal/GU negative Renal ROS  negative genitourinary   Musculoskeletal   Abdominal   Peds  Hematology negative hematology ROS (+)   Anesthesia Other Findings   Reproductive/Obstetrics negative OB ROS                             Anesthesia Physical Anesthesia Plan  ASA: 2 and emergent  Anesthesia Plan: General   Post-op Pain Management:    Induction:   PONV Risk Score and Plan: Propofol infusion  Airway Management Planned:   Additional Equipment:   Intra-op Plan:   Post-operative Plan:   Informed Consent: I have reviewed the patients History and Physical, chart, labs and discussed the procedure including the risks, benefits and alternatives for the proposed anesthesia with the patient or authorized representative who has indicated his/her understanding and acceptance.     Dental Advisory Given  Plan Discussed with: CRNA  Anesthesia Plan Comments:        Anesthesia Quick Evaluation

## 2023-10-17 NOTE — Progress Notes (Signed)
   10/17/23 1209  TOC Brief Assessment  Insurance and Status Lapsed  Patient has primary care physician No  Home environment has been reviewed [facility]  Prior level of function: inmate  Prior/Current Home Services No current home services  Social Drivers of Health Review SDOH reviewed no interventions necessary  Readmission risk has been reviewed Yes  Transition of care needs no transition of care needs at this time   Patient in suite getting colonoscopy,  Plans to return to [facility]  Transition of Care Department [facility]) has reviewed patient and no TOC needs have been identified at this time. We will continue to monitor patient advancement through interdisciplinary progression rounds. If new patient transition needs arise, please place a TOC consult.

## 2023-10-17 NOTE — Brief Op Note (Addendum)
 10/17/2023  1:56 PM  PATIENT:  William Francis  52 y.o. male  PRE-OPERATIVE DIAGNOSIS:  hematochezia, active lower GI bleed  POST-OPERATIVE DIAGNOSIS:  sigmoid mass@ 25-28;hemorrhoids;  PROCEDURE:  Procedure(s): COLONOSCOPY (N/A)  SURGEON:  Surgeons and Role:    * Eartha Angelia Sieving, MD - Primary  Patient underwent colonoscopy under propofol sedation.  Tolerated the procedure adequately.   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  Biopsied.  Tattooed.  - Non-bleeding internal hemorrhoids.   RECOMMENDATIONS - Return patient to hospital ward for ongoing care.  - Clear liquid diet.  - Await pathology results.  - Check CEA. - Chest CT with IV contrast. - Case discussed with Dr. Evonnie - will proceed with possible partial colectomy on Monday. - Daily H/H - Repeat colonoscopy in 1 year for surveillance.  Sieving Eartha, MD Gastroenterology and Hepatology [facility] Gastroenterology

## 2023-10-17 NOTE — Op Note (Addendum)
 [facility] Patient Name: William Francis Procedure Date: 10/17/2023 10:58 AM MRN: [id number] Date of Birth: 10-10-1970 Attending MD: Toribio Fortune , , [id number] CSN: [id number] Age: 53 Admit Type: Inpatient Procedure:                Colonoscopy Indications:              Rectal bleeding Providers:                Toribio Fortune, Olam Ada, RN, Jon Loge Referring MD:              Medicines:                Monitored Anesthesia Care Complications:            No immediate complications. Estimated Blood Loss:     Estimated blood loss: none. Procedure:                Pre-Anesthesia Assessment:                           - Prior to the procedure, a History and Physical                            was performed, and patient medications, allergies                            and sensitivities were reviewed. The patient's                            tolerance of previous anesthesia was reviewed.                           - The risks and benefits of the procedure and the                            sedation options and risks were discussed with the                            patient. All questions were answered and informed                            consent was obtained.                           - ASA Grade Assessment: I - A normal, healthy                            patient.                           After obtaining informed consent, the colonoscope                            was passed under direct vision. Throughout the                            procedure, the patient's blood pressure, pulse, and  oxygen saturations were monitored continuously. The                            [id number]-HQ190L ([id number]) scope was introduced through                            the anus and advanced to the the cecum, identified                            by appendiceal orifice and ileocecal valve. The                            colonoscopy was performed without difficulty. The                             patient tolerated the procedure well. The quality                            of the bowel preparation was adequate. Scope In: 1:12:14 PM Scope Out: 1:42:43 PM Scope Withdrawal Time: 0 hours 23 minutes 35 seconds  Total Procedure Duration: 0 hours 30 minutes 29 seconds  Findings:      The perianal and digital rectal examinations were normal.      An infiltrative and ulcerated non-obstructing large mass was found in       the sigmoid colon. The mass was partially circumferential (involving       one-half of the lumen circumference). The mass measured three cm in       length. Oozing was present. This was biopsied with a cold forceps for       histology. Area just distal to the mass was tattooed with an injection       of 3 mL of [place] ink.      Non-bleeding internal hemorrhoids were found during retroflexion. The       hemorrhoids were small. 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 Biopsied.                            Tattooed.                           - Non-bleeding internal hemorrhoids. Moderate Sedation:      Per Anesthesia Care Recommendation:           - Return patient to hospital ward for ongoing care.                           - Clear liquid diet.                           - Await pathology results.                           - Check CEA.                           - Chest CT with IV  contrast.                           - Case discussed with Dr. Evonnie - will proceed                            with possible partial colectomy on Monday.                           - Daily H/H                           - Repeat colonoscopy in 1 year for surveillance. Procedure Code(s):        --- Professional ---                           [id number], Colonoscopy, flexible; with biopsy, single                            or multiple                           45381, Colonoscopy, flexible; with directed                            submucosal injection(s),  any substance Diagnosis Code(s):        --- Professional ---                           C18.7, Malignant neoplasm of sigmoid colon                           K64.8, Other hemorrhoids                           K62.5, Hemorrhage of anus and rectum CPT copyright 2022 American Medical Association. All rights reserved. The codes documented in this report are preliminary and upon coder review [place]  be revised to meet current compliance requirements. Toribio Fortune, MD Toribio Fortune,  10/17/2023 1:58:44 PM This report has been signed electronically. Number of Addenda: 0

## 2023-10-17 NOTE — Progress Notes (Signed)
We will proceed with colonoscopy as scheduled.  I thoroughly discussed with the patient the procedure, including the risks involved. Patient understands what the procedure involves including the benefits and any risks. Patient understands alternatives to the proposed procedure. Risks including (but not limited to) bleeding, tearing of the lining (perforation), rupture of adjacent organs, problems with heart and lung function, infection, and medication reactions. A small percentage of complications may require surgery, hospitalization, repeat endoscopic procedure, and/or transfusion.  Patient understood and agreed.  Maylon Peppers, MD Gastroenterology and Hepatology [facility] Gastroenterology

## 2023-10-17 NOTE — Transfer of Care (Signed)
 Immediate Anesthesia Transfer of Care Note  Patient: William Francis  Procedure(s) Performed: COLONOSCOPY  Patient Location: PACU  Anesthesia Type:MAC  Level of Consciousness: drowsy  Airway & Oxygen Therapy: Patient Spontanous Breathing  Post-op Assessment: Report given to RN and Post -op Vital signs reviewed and stable  Post vital signs: Reviewed and stable  Last Vitals:  Vitals Value Taken Time  BP 78/43 (63) 10/17/23 13:56  Temp    Pulse 97 10/17/23 13:56  Resp 15 10/17/23 13:56  SpO2 95% on RA 10/17/23 13:56    Last Pain:  Vitals:   10/17/23 1106  TempSrc:   PainSc: 0-No pain         Complications: No notable events documented.

## 2023-10-17 NOTE — Progress Notes (Signed)
 PROGRESS NOTE  William Francis FMW:[id number] DOB: Feb 17, 1971 DOA: 10/16/2023 PCP: Patient, No Pcp Per  Brief History:  53 year old with no documented chronic medical issues presented from [facility] corrections facility with hematochezia.  The patient had 3 episodes prior to arrival in the morning of 10/16/2023.  He had some dizziness and nausea with abdominal cramping.  He states that he had noticed some small amounts blood in his stool on 10/15/2023. The patient states that he has had some intermittent hematochezia for at least the last 2 years for which she has not sought any medical care.  However, he states that this episode has been a greater volume.  Denies alcohol, illicit drug use, NSAIDs.SABRA He denies any fevers, chills, chest pain, shortness of breath, coughing hemoptysis.  He denies any dysuria or hematuria.  He denies any melena.  Patient was afebrile with stable with oxygen saturation 97% room air.  WBC 6.4, hemoglobin 14.2, platelets 306.  Sodium 140, potassium 3.9, bicarbonate 26, serum creatinine 0.96.  CTA abd/pelvis showed active GI bleed in the sigmoid colon.  There was mild colonic wall thickening adjacent to the area of the bleeding.  With concern for diverticular bleed versus neoplasm.  There was some mild narrowing of the proximal celiac trunk. EDP spoke with Ahmed  who agrees to consult.   Assessment/Plan: Hematochezia/Lower GI bleed/Acute blood loss anemia -GI consulted>>tentatively plans for colonoscopy 6/27 - Type and screen - Monitor serial H&H - Clear liquid diet - Hgb down to 10.9 although some is due to dilution  Sigmoid Colon Mass -10/17/23 colonoscopy--sigmoid colon mass with non-bleeding internal hemorrhoids -CT chest -general surgery consult   Elevated blood pressure - No history of hypertension - Monitor for now          Family Communication:  no  Family at bedside  Consultants:  GI, general surgery  Code Status:  FULL   DVT  Prophylaxis:  SCDs   Procedures: As Listed in Progress Note Above  Antibiotics: None     Subjective: Pt states his stool is clear of any blood now.  Patient denies fevers, chills, headache, chest pain, dyspnea, nausea, vomiting, diarrhea, abdominal pain, dysuria, hematuria, hematochezia, and melena.   Objective: Vitals:   10/17/23 1402 10/17/23 1409 10/17/23 1415 10/17/23 1450  BP: (!) 81/64 116/77 110/72 115/86  Pulse: 85 98 86 92  Resp: 12 (!) 21 12 16   Temp:    98 F (36.7 C)  TempSrc:    Oral  SpO2: 97% 99% 98% 99%  Weight:      Height:        Intake/Output Summary (Last 24 hours) at 10/17/2023 1837 Last data filed at 10/17/2023 1431 Gross per 24 hour  Intake 700 ml  Output --  Net 700 ml   Weight change:  Exam:  General:  Pt is alert, follows commands appropriately, not in acute distress HEENT: No icterus, No thrush, No neck mass, [place]/AT Cardiovascular: RRR, S1/S2, no rubs, no gallops Respiratory: CTA bilaterally, no wheezing, no crackles, no rhonchi Abdomen: Soft/+BS, non tender, non distended, no guarding Extremities: No edema, No lymphangitis, No petechiae, No rashes, no synovitis   Data Reviewed: I have personally reviewed following labs and imaging studies Basic Metabolic Panel: Recent Labs  Lab 10/16/23 1034 10/17/23 0457  NA 140 139  K 3.9 3.6  CL 104 104  CO2 26 29  GLUCOSE 109* 112*  BUN 14 12  CREATININE 0.96 0.94  CALCIUM 9.0 8.6*   Liver Function Tests: Recent Labs  Lab 10/16/23 1034  AST 19  ALT 21  ALKPHOS 62  BILITOT 0.5  PROT 7.3  ALBUMIN 4.1   Recent Labs  Lab 10/16/23 1034  LIPASE 25   No results for input(s): AMMONIA in the last 168 hours. Coagulation Profile: Recent Labs  Lab 10/17/23 0457  INR 1.1   CBC: Recent Labs  Lab 10/16/23 1034 10/16/23 1650 10/16/23 2214 10/17/23 0457 10/17/23 0942 10/17/23 1618  WBC 6.4  --   --   --   --   --   NEUTROABS 2.8  --   --   --   --   --   HGB 14.2 13.2  12.5* 11.7* 11.8* 10.9*  HCT 40.8 38.3* 35.9* 33.8* 34.0* 31.5*  MCV 88.7  --   --   --   --   --   PLT 306  --   --   --   --   --    Cardiac Enzymes: No results for input(s): CKTOTAL, CKMB, CKMBINDEX, TROPONINI in the last 168 hours. BNP: Invalid input(s): POCBNP CBG: No results for input(s): GLUCAP in the last 168 hours. HbA1C: No results for input(s): HGBA1C in the last 72 hours. Urine analysis:    Component Value Date/Time   COLORURINE YELLOW 10/16/2023 1016   APPEARANCEUR CLEAR 10/16/2023 1016   LABSPEC 1.023 10/16/2023 1016   PHURINE 5.0 10/16/2023 1016   GLUCOSEU NEGATIVE 10/16/2023 1016   HGBUR SMALL (A) 10/16/2023 1016   BILIRUBINUR NEGATIVE 10/16/2023 1016   KETONESUR NEGATIVE 10/16/2023 1016   PROTEINUR NEGATIVE 10/16/2023 1016   UROBILINOGEN 0.2 04/20/2013 1424   NITRITE NEGATIVE 10/16/2023 1016   LEUKOCYTESUR NEGATIVE 10/16/2023 1016   Sepsis Labs: @LABRCNTIP (procalcitonin:4,lacticidven:4) )No results found for this or any previous visit (from the past 240 hours).   Scheduled Meds: Continuous Infusions:  Procedures/Studies: CT Angio Abd/Pel W and/or Wo Contrast Result Date: 10/16/2023 CLINICAL DATA:  Lower GI bleed.  Hematochezia. EXAM: CTA ABDOMEN AND PELVIS WITHOUT AND WITH CONTRAST TECHNIQUE: Multidetector CT imaging of the abdomen and pelvis was performed using the standard protocol during bolus administration of intravenous contrast. Multiplanar reconstructed images and MIPs were obtained and reviewed to evaluate the vascular anatomy. RADIATION DOSE REDUCTION: This exam was performed according to the departmental dose-optimization program which includes automated exposure control, adjustment of the mA and/or kV according to patient size and/or use of iterative reconstruction technique. CONTRAST:  OMNIPAQUE  IOHEXOL  350 MG/ML SOLN COMPARISON:  CT abdomen pelvis 04/20/2013 FINDINGS: VASCULAR Aorta: Normal caliber aorta without aneurysm,  dissection, vasculitis or significant stenosis. Celiac: Mild narrowing of the proximal celiac trunk and the configuration is suggestive for median arcuate ligament compression. Main branches of the celiac trunk are patent. No evidence for aneurysm or dissection. SMA: Patent without evidence of aneurysm, dissection, vasculitis or significant stenosis. Renals: Both renal arteries are patent without evidence of aneurysm, dissection, vasculitis, fibromuscular dysplasia or significant stenosis. IMA: Patent without evidence of aneurysm, dissection, vasculitis or significant stenosis. Inflow: Patent without evidence of aneurysm, dissection, vasculitis or significant stenosis. Proximal Outflow: Proximal femoral arteries are patent bilaterally. Veins: IVC and iliac veins are patent. Renal veins are patent. Main portal venous system is patent. Prominent venous structures in the pelvis. Review of the MIP images confirms the above findings. NON-VASCULAR Lower chest: Mild motion artifact at the lung bases. No significant consolidation at the lung bases. No pleural effusions. Hepatobiliary: Small hypodensities in liver likely represent hepatic cysts  but some are too small to definitively characterize. Normal appearance of the gallbladder. No biliary dilatation. Pancreas: Unremarkable. No pancreatic ductal dilatation or surrounding inflammatory changes. Spleen: Normal in size without focal abnormality. Adrenals/Urinary Tract: Normal adrenal glands. Negative for kidney stones. No hydronephrosis. Normal urinary bladder. No suspicious renal lesions. Stomach/Bowel: Normal stomach. Evidence for contrast extravasation on both the arterial and venous phase imaging in the sigmoid colon located in the right lower abdomen. This finding is best seen on image 163/10. There is mild colonic wall thickening adjacent to the area of bleeding, best seen on the venous phase imaging, image 66, sequence 16. Normal appearance of the small bowel.  Question previous appendectomy. No significant colonic diverticula. Lymphatic: No significant lymph node enlargement in the abdomen and pelvis but there is a mildly prominent lymph node or lymph nodes near the sigmoid colon wall thickening and bleeding on image 72/19 and image 89/20. Reproductive: Focal low-density area in the central aspect of the prostate on image 93/16 and question previous intervention in this area such as the TURP. Prostate size appears to be within normal limits. Other: Right inguinal hernia containing fat. Negative for free fluid. Small umbilical hernia containing fat. Musculoskeletal: No acute bone abnormality. No suspicious osseous lesions. IMPRESSION: 1. Positive for active GI bleeding in the sigmoid colon. 2. Mild colonic wall thickening adjacent to the area of bleeding. GI bleeding etiology is uncertain. Although this could be associated with a diverticular bleed, cannot exclude an underlying colonic neoplasm. In addition, there is a mildly prominent lymph node near the sigmoid colon that is nonspecific. 3. Small hypodense structures in liver probably represent hepatic cysts. However, depending on the etiology for the GI bleeding, this Boike need further characterization. 4. Mild narrowing of the proximal celiac trunk and the configuration is suggestive for median arcuate ligament compression. 5. Right inguinal hernia containing fat. Small umbilical hernia containing fat. 6. Focal low-density area in the prostate and question previous surgical intervention in this area. These results were called by telephone at the time of interpretation on 10/16/2023 at 12:47 pm to provider CHRISTOPHER RIGNEY , who verbally acknowledged these results. Electronically Signed   By: Juliene Balder M.D.   On: 10/16/2023 12:49    Alm Schneider, DO  [facility]  If 7PM-7AM, please contact night-coverage [email or web address] Password [facility] 10/17/2023, 6:37 PM   LOS: 0 days

## 2023-10-18 ENCOUNTER — Inpatient Hospital Stay (HOSPITAL_COMMUNITY)

## 2023-10-18 DIAGNOSIS — K922 Gastrointestinal hemorrhage, unspecified: Secondary | ICD-10-CM | POA: Diagnosis not present

## 2023-10-18 DIAGNOSIS — K6389 Other specified diseases of intestine: Secondary | ICD-10-CM | POA: Diagnosis not present

## 2023-10-18 DIAGNOSIS — K921 Melena: Secondary | ICD-10-CM | POA: Diagnosis not present

## 2023-10-18 LAB — CBC
HCT: 31 % — ABNORMAL LOW (ref 39.0–52.0)
Hemoglobin: 10.8 g/dL — ABNORMAL LOW (ref 13.0–17.0)
MCH: 31.1 pg (ref 26.0–34.0)
MCHC: 34.8 g/dL (ref 30.0–36.0)
MCV: 89.3 fL (ref 80.0–100.0)
Platelets: 258 10*3/uL (ref 150–400)
RBC: 3.47 MIL/uL — ABNORMAL LOW (ref 4.22–5.81)
RDW: 13.6 % (ref 11.5–15.5)
WBC: 7.1 10*3/uL (ref 4.0–10.5)
nRBC: 0 % (ref 0.0–0.2)

## 2023-10-18 MED ORDER — METRONIDAZOLE 500 MG PO TABS
1000.0000 mg | ORAL_TABLET | Freq: Once | ORAL | Status: AC
  Administered 2023-10-19: 1000 mg via ORAL
  Filled 2023-10-18: qty 2

## 2023-10-18 MED ORDER — GADOBUTROL 1 MMOL/ML IV SOLN
9.0000 mL | Freq: Once | INTRAVENOUS | Status: AC | PRN
  Administered 2023-10-18: 9 mL via INTRAVENOUS

## 2023-10-18 MED ORDER — NEOMYCIN SULFATE 500 MG PO TABS
1000.0000 mg | ORAL_TABLET | Freq: Once | ORAL | Status: AC
  Administered 2023-10-19: 1000 mg via ORAL
  Filled 2023-10-18: qty 2

## 2023-10-18 NOTE — Consult Note (Signed)
 [facility] Consult  Reason for Consult: Sigmoid colon mass, likely colon cancer Referring Physician: Dr. Eartha  Chief Complaint   Abdominal Pain     HPI: William Francis is a 53 y.o. male who was admitted to the hospital with hematochezia.  He has noted some small amounts of blood with bowel movements over the last year or so, but starting 2 days ago, he had 3 episodes of large bloody bowel movements.  He denies any family history of colon cancer, and denies any personal screening for colon cancer in the past.  He denies abdominal pain, nausea, vomiting, and dark stools.  He has no significant past medical history.  He is not taking any blood thinning medications at baseline.  He also denies any history of abdominal surgeries.  He underwent a CTA of the abdomen while in the emergency department which demonstrated concern for an active GI bleed in the sigmoid colon with mild colonic wall thickening adjacent to the area of bleeding, and neoplasm could not be ruled out.  He subsequently underwent a colonoscopy with Dr. Eartha yesterday, which demonstrated a colonic mass concerning for cancer within the sigmoid colon.  The mass did have some mild oozing.  General surgery was consulted to evaluate for colectomy given oozing sigmoid colon mass and patient currently being incarcerated.  This morning, the patient has no acute complaints.  He is tolerating his clear liquids without nausea and vomiting.  He denies any further bloody bowel movements.  History reviewed. No pertinent past medical history.  History reviewed. No pertinent surgical history.  History reviewed. No pertinent family history.  Social History   Tobacco Use   Smoking status: Never    Passive exposure: Never  Vaping Use   Vaping status: Never Used  Substance Use Topics   Alcohol use: No   Drug use: No    Medications: I have reviewed the patient's current medications.  Allergies  Allergen Reactions    Penicillins Anaphylaxis     ROS:  Pertinent items are noted in HPI.  Blood pressure 95/69, pulse 91, temperature 98.1 F (36.7 C), temperature source Oral, resp. rate 15, height 6' (1.829 m), weight 93.7 kg, SpO2 96%. Physical Exam Vitals reviewed.  Constitutional:      Appearance: He is well-developed.  HENT:     Head: Normocephalic and atraumatic.   Cardiovascular:     Rate and Rhythm: Normal rate.  Pulmonary:     Effort: Pulmonary effort is normal.  Abdominal:     Comments: Abdomen soft, nondistended, no percussion tenderness, nontender to palpation; no rigidity, guarding, rebound tenderness   Skin:    General: Skin is warm and dry.   Neurological:     General: No focal deficit present.     Mental Status: He is alert and oriented to person, place, and time.   Psychiatric:        Mood and Affect: Mood normal.        Behavior: Behavior normal.     Results: Results for orders placed or performed during the hospital encounter of 10/16/23 (from the past 48 hours)  Hemoglobin and hematocrit, blood     Status: Abnormal   Collection Time: 10/16/23  4:50 PM  Result Value Ref Range   Hemoglobin 13.2 13.0 - 17.0 g/dL   HCT 61.6 (L) 60.9 - 47.9 %    Comment: Performed at [facility], [address] [place], [place] [address]  HIV Antibody (routine testing w rflx)  Status: None   Collection Time: 10/16/23  4:50 PM  Result Value Ref Range   HIV Screen 4th Generation wRfx Non Reactive Non Reactive    Comment: Performed at [facility], [address], [place], [place] [address]  Hemoglobin and hematocrit, blood     Status: Abnormal   Collection Time: 10/16/23 10:14 PM  Result Value Ref Range   Hemoglobin 12.5 (L) 13.0 - 17.0 g/dL   HCT 64.0 (L) 60.9 - 47.9 %    Comment: Performed at [facility], [address] [place], [place] [address]  Hemoglobin and hematocrit, blood     Status: Abnormal   Collection Time: 10/17/23  4:57 AM  Result Value Ref Range    Hemoglobin 11.7 (L) 13.0 - 17.0 g/dL   HCT 66.1 (L) 60.9 - 47.9 %    Comment: Performed at [facility], [address], [place], [place] [address]  Basic metabolic panel     Status: Abnormal   Collection Time: 10/17/23  4:57 AM  Result Value Ref Range   Sodium 139 135 - 145 mmol/L   Potassium 3.6 3.5 - 5.1 mmol/L   Chloride 104 98 - 111 mmol/L   CO2 29 22 - 32 mmol/L   Glucose, Bld 112 (H) 70 - 99 mg/dL    Comment: Glucose reference range applies only to samples taken after fasting for at least 8 hours.   BUN 12 6 - 20 mg/dL   Creatinine, Ser 9.05 0.61 - 1.24 mg/dL   Calcium 8.6 (L) 8.9 - 10.3 mg/dL   GFR, Estimated >39 >39 mL/min    Comment: (NOTE) Calculated using the CKD-EPI Creatinine Equation (2021)    Anion gap 6 5 - 15    Comment: Performed at [facility], [address], [place], [place] [address]  Protime-INR     Status: None   Collection Time: 10/17/23  4:57 AM  Result Value Ref Range   Prothrombin Time 14.4 11.4 - 15.2 seconds   INR 1.1 0.8 - 1.2    Comment: (NOTE) INR goal varies based on device and disease states. Performed at [facility], [address], [place], [place] [address]   Hemoglobin and hematocrit, blood     Status: Abnormal   Collection Time: 10/17/23  9:42 AM  Result Value Ref Range   Hemoglobin 11.8 (L) 13.0 - 17.0 g/dL   HCT 65.9 (L) 60.9 - 47.9 %    Comment: Performed at [facility], [address], [place], [place] [address]  Hemoglobin and hematocrit, blood     Status: Abnormal   Collection Time: 10/17/23  4:18 PM  Result Value Ref Range   Hemoglobin 10.9 (L) 13.0 - 17.0 g/dL   HCT 68.4 (L) 60.9 - 47.9 %    Comment: Performed at [facility], [address], [place], [place] [address]  CBC     Status: Abnormal   Collection Time: 10/18/23  5:02 AM  Result Value Ref Range   WBC 7.1 4.0 - 10.5 K/uL   RBC 3.47 (L) 4.22 - 5.81 MIL/uL   Hemoglobin 10.8 (L) 13.0 - 17.0 g/dL   HCT 68.9 (L) 60.9 - 47.9 %   MCV 89.3 80.0 - 100.0 fL   MCH 31.1  26.0 - 34.0 pg   MCHC 34.8 30.0 - 36.0 g/dL   RDW 86.3 88.4 - 84.4 %   Platelets 258 150 - 400 K/uL   nRBC 0.0 0.0 - 0.2 %    Comment: Performed at [facility], [address] [place], [place] [address]    CT CHEST W CONTRAST Result Date: 10/18/2023 CLINICAL DATA:  Colon cancer.  Staging. * Tracking Code: BO * EXAM: CT CHEST WITH CONTRAST TECHNIQUE: Multidetector CT imaging of the chest was performed during intravenous contrast administration. RADIATION DOSE REDUCTION: This exam was performed according to the departmental dose-optimization program which includes automated exposure control, adjustment of the mA and/or kV according to patient size and/or use of iterative reconstruction technique. CONTRAST:  75mL OMNIPAQUE  IOHEXOL  300 MG/ML  SOLN COMPARISON:  None Available. FINDINGS: Cardiovascular: Heart size is normal. No pericardial effusion. No significant aortic or coronary artery atherosclerotic calcifications. Mediastinum/Nodes: No enlarged mediastinal, hilar, or axillary lymph nodes. Thyroid gland, trachea, and esophagus demonstrate no significant findings. Lungs/Pleura: No pleural effusion, airspace consolidation, atelectasis, or pneumothorax. The central airways are grossly patent. Tiny nodule within the right upper lobe measures 3 mm, image 49/3. In the posterior right lower lobe there is a 2 mm nodule, image 23/3. Upper Abdomen: No acute abnormality. Multiple scattered low-attenuation lesions within both lobes of liver. The largest lesion is in the right lobe of liver measuring 1.4 x 0.8 cm and fluid attenuation, image 163/2. The remaining are less than 1 cm in technically too small to characterize. Musculoskeletal: No chest wall abnormality. No acute or significant osseous findings. IMPRESSION: 1. No acute cardiopulmonary abnormalities. No highly suspicious findings to suggest metastatic disease to the chest. 2. There are 2 tiny nodules within the right lung measuring up to 3 mm. These are  nonspecific. Attention on future surveillance imaging is advised in this patient with newly diagnosed colon cancer. 3. Multiple scattered low-attenuation lesions within both lobes of liver. Most of these are less than 1 cm and are technically too small to reliably characterize. The largest lesion is in the right lobe of liver measuring 1.4 x 0.8 cm. These are favored to represent benign cysts or hemangiomas. In a patient with newly diagnosed colon cancer consider more definitive characterization of the subcentimeter lesions with nonemergent dedicated abdominal MRI with and without contrast. Electronically Signed   By: Waddell Calk M.D.   On: 10/18/2023 04:41     Assessment & Plan:  William Francis is a 53 y.o. male who was admitted with hematochezia and found to have a sigmoid colon mass on colonoscopy.  Imaging and blood work evaluated by myself.  -We discussed colon cancer, and need for surgical excision while inpatient, especially given his recent bleed from the mass. -Given that he underwent a colonoscopy prep yesterday, will maintain the patient on clear liquids until planned surgery on Monday. -Will order antibiotic prep to be administered to the patient on Sunday -The risk and benefits of sigmoidectomy were discussed including but not limited to bleeding, infection, injury to surrounding structures, need for additional procedures.  After careful consideration, William Francis has decided to proceed with surgery.  -Patient tentatively scheduled for surgery on 6/30 -While patient remains in the hospital, will recommend oncology to evaluate him -CT of the chest demonstrates no concern for pulmonary metastases.  There are multiple low-attenuation lesions within both lobes of the liver, which are favored to be benign cysts or hemangiomas, however with the recent diagnosis of colon cancer, will need abdominal MRI for further characterization -Will discuss need for additional imaging with  hospitalist -CLD - Appreciate hospitalist and GI recommendations  All questions were answered to the satisfaction of the patient.  Note: Portions of this report [place] have been transcribed using voice recognition  software. Every effort has been made to ensure accuracy; however, inadvertent computerized transcription errors [place] still be present.   -- Dorothyann Brittle, DO [facility] [address] [address] [place], [place] [address] [phone] (office)

## 2023-10-18 NOTE — Plan of Care (Signed)
   Problem: Education: Goal: Knowledge of General Education information will improve Description: Including pain rating scale, medication(s)/side effects and non-pharmacologic comfort measures Outcome: Progressing   Problem: Clinical Measurements: Goal: Will remain free from infection Outcome: Progressing   Problem: Activity: Goal: Risk for activity intolerance will decrease Outcome: Progressing

## 2023-10-18 NOTE — Progress Notes (Signed)
 PROGRESS NOTE  William Francis FMW:[id number] DOB: 1970/07/19 DOA: 10/16/2023 PCP: Patient, No Pcp Per  Brief History:  53 year old with no documented chronic medical issues presented from [facility] with hematochezia.  The patient had 3 episodes prior to arrival in the morning of 10/16/2023.  He had some dizziness and nausea with abdominal cramping.  He states that he had noticed some small amounts blood in his stool on 10/15/2023. The patient states that he has had some intermittent hematochezia for at least the last 2 years for which she has not sought any medical care.  However, he states that this episode has been a greater volume.  Denies alcohol, illicit drug use, NSAIDs.SABRA He denies any fevers, chills, chest pain, shortness of breath, coughing hemoptysis.  He denies any dysuria or hematuria.  He denies any melena.  Patient was afebrile with stable with oxygen saturation 97% room air.  WBC 6.4, hemoglobin 14.2, platelets 306.  Sodium 140, potassium 3.9, bicarbonate 26, serum creatinine 0.96.  CTA abd/pelvis showed active GI bleed in the sigmoid colon.  There was mild colonic wall thickening adjacent to the area of the bleeding.  With concern for diverticular bleed versus neoplasm.  There was some mild narrowing of the proximal celiac trunk. EDP spoke with Ahmed  who agrees to consult.   Assessment/Plan:  Hematochezia/Lower GI bleed/Acute blood loss anemia -GI consulted>>tentatively plans for colonoscopy 6/27 - Typed and screenef - Monitor serial H&H - Clear liquid diet - Hgb down to 10.9 although some is due to dilution -Hgb now stable, no further bleeding   Sigmoid Colon Mass -10/17/23 colonoscopy--sigmoid colon mass with non-bleeding internal hemorrhoids -CT chest--No highly suspicious findings to suggest metastatic disease to the chest; nonspecific liver lesions <1 cm;  largest lesion is in the right lobe of liver measuring 1.4 x 0.8 cm -general  surgery consult appreciated   Elevated blood pressure - No history of hypertension - Monitor for now   Liver Lesions -MR liver                Family Communication:  no  Family at bedside   Consultants:  GI, general surgery   Code Status:  FULL    DVT Prophylaxis:  SCDs     Procedures: As Listed in Progress Note Above   Antibiotics: None         Subjective:  Patient denies fevers, chills, headache, chest pain, dyspnea, nausea, vomiting, diarrhea, abdominal pain, dysuria, hematuria, hematochezia, and melena.  Objective: Vitals:   10/17/23 1450 10/17/23 1956 10/18/23 0337 10/18/23 1438  BP: 115/86 106/76 95/69 106/75  Pulse: 92 100 91 90  Resp: 16 15 15 17   Temp: 98 F (36.7 C) 99.1 F (37.3 C) 98.1 F (36.7 C) 98.6 F (37 C)  TempSrc: Oral Oral Oral Oral  SpO2: 99% 98% 96% 95%  Weight:      Height:        Intake/Output Summary (Last 24 hours) at 10/18/2023 1810 Last data filed at 10/18/2023 1730 Gross per 24 hour  Intake 960 ml  Output --  Net 960 ml   Weight change: -5.3 kg Exam:  General:  Pt is alert, follows commands appropriately, not in acute distress HEENT: No icterus, No thrush, No neck mass, [place]/AT Cardiovascular: RRR, S1/S2, no rubs, no gallops Respiratory: CTA bilaterally, no wheezing, no crackles, no rhonchi Abdomen: Soft/+BS, non tender, non distended, no guarding Extremities: No edema, No lymphangitis, No  petechiae, No rashes, no synovitis   Data Reviewed: I have personally reviewed following labs and imaging studies Basic Metabolic Panel: Recent Labs  Lab 10/16/23 1034 10/17/23 0457  NA 140 139  K 3.9 3.6  CL 104 104  CO2 26 29  GLUCOSE 109* 112*  BUN 14 12  CREATININE 0.96 0.94  CALCIUM 9.0 8.6*   Liver Function Tests: Recent Labs  Lab 10/16/23 1034  AST 19  ALT 21  ALKPHOS 62  BILITOT 0.5  PROT 7.3  ALBUMIN 4.1   Recent Labs  Lab 10/16/23 1034  LIPASE 25   No results for input(s): AMMONIA in the  last 168 hours. Coagulation Profile: Recent Labs  Lab 10/17/23 0457  INR 1.1   CBC: Recent Labs  Lab 10/16/23 1034 10/16/23 1650 10/16/23 2214 10/17/23 0457 10/17/23 0942 10/17/23 1618 10/18/23 0502  WBC 6.4  --   --   --   --   --  7.1  NEUTROABS 2.8  --   --   --   --   --   --   HGB 14.2   < > 12.5* 11.7* 11.8* 10.9* 10.8*  HCT 40.8   < > 35.9* 33.8* 34.0* 31.5* 31.0*  MCV 88.7  --   --   --   --   --  89.3  PLT 306  --   --   --   --   --  258   < > = values in this interval not displayed.   Cardiac Enzymes: No results for input(s): CKTOTAL, CKMB, CKMBINDEX, TROPONINI in the last 168 hours. BNP: Invalid input(s): POCBNP CBG: No results for input(s): GLUCAP in the last 168 hours. HbA1C: No results for input(s): HGBA1C in the last 72 hours. Urine analysis:    Component Value Date/Time   COLORURINE YELLOW 10/16/2023 1016   APPEARANCEUR CLEAR 10/16/2023 1016   LABSPEC 1.023 10/16/2023 1016   PHURINE 5.0 10/16/2023 1016   GLUCOSEU NEGATIVE 10/16/2023 1016   HGBUR SMALL (A) 10/16/2023 1016   BILIRUBINUR NEGATIVE 10/16/2023 1016   KETONESUR NEGATIVE 10/16/2023 1016   PROTEINUR NEGATIVE 10/16/2023 1016   UROBILINOGEN 0.2 04/20/2013 1424   NITRITE NEGATIVE 10/16/2023 1016   LEUKOCYTESUR NEGATIVE 10/16/2023 1016   Sepsis Labs: @LABRCNTIP (procalcitonin:4,lacticidven:4) )No results found for this or any previous visit (from the past 240 hours).   Scheduled Meds:  [START ON 10/19/2023] metroNIDAZOLE  1,000 mg Oral Once   Followed by   [START ON 10/19/2023] metroNIDAZOLE  1,000 mg Oral Once   Followed by   [START ON 10/19/2023] metroNIDAZOLE  1,000 mg Oral Once   [START ON 10/19/2023] neomycin  1,000 mg Oral Once   Followed by   [START ON 10/19/2023] neomycin  1,000 mg Oral Once   Followed by   [START ON 10/19/2023] neomycin  1,000 mg Oral Once   Continuous Infusions:  Procedures/Studies: CT CHEST W CONTRAST Result Date: 10/18/2023 CLINICAL DATA:   Colon cancer.  Staging. * Tracking Code: BO * EXAM: CT CHEST WITH CONTRAST TECHNIQUE: Multidetector CT imaging of the chest was performed during intravenous contrast administration. RADIATION DOSE REDUCTION: This exam was performed according to the departmental dose-optimization program which includes automated exposure control, adjustment of the mA and/or kV according to patient size and/or use of iterative reconstruction technique. CONTRAST:  75mL OMNIPAQUE  IOHEXOL  300 MG/ML  SOLN COMPARISON:  None Available. FINDINGS: Cardiovascular: Heart size is normal. No pericardial effusion. No significant aortic or coronary artery atherosclerotic calcifications. Mediastinum/Nodes: No enlarged mediastinal, hilar,  or axillary lymph nodes. Thyroid gland, trachea, and esophagus demonstrate no significant findings. Lungs/Pleura: No pleural effusion, airspace consolidation, atelectasis, or pneumothorax. The central airways are grossly patent. Tiny nodule within the right upper lobe measures 3 mm, image 49/3. In the posterior right lower lobe there is a 2 mm nodule, image 23/3. Upper Abdomen: No acute abnormality. Multiple scattered low-attenuation lesions within both lobes of liver. The largest lesion is in the right lobe of liver measuring 1.4 x 0.8 cm and fluid attenuation, image 163/2. The remaining are less than 1 cm in technically too small to characterize. Musculoskeletal: No chest wall abnormality. No acute or significant osseous findings. IMPRESSION: 1. No acute cardiopulmonary abnormalities. No highly suspicious findings to suggest metastatic disease to the chest. 2. There are 2 tiny nodules within the right lung measuring up to 3 mm. These are nonspecific. Attention on future surveillance imaging is advised in this patient with newly diagnosed colon cancer. 3. Multiple scattered low-attenuation lesions within both lobes of liver. Most of these are less than 1 cm and are technically too small to reliably characterize.  The largest lesion is in the right lobe of liver measuring 1.4 x 0.8 cm. These are favored to represent benign cysts or hemangiomas. In a patient with newly diagnosed colon cancer consider more definitive characterization of the subcentimeter lesions with nonemergent dedicated abdominal MRI with and without contrast. Electronically Signed   By: Waddell Calk M.D.   On: 10/18/2023 04:41   CT Angio Abd/Pel W and/or Wo Contrast Result Date: 10/16/2023 CLINICAL DATA:  Lower GI bleed.  Hematochezia. EXAM: CTA ABDOMEN AND PELVIS WITHOUT AND WITH CONTRAST TECHNIQUE: Multidetector CT imaging of the abdomen and pelvis was performed using the standard protocol during bolus administration of intravenous contrast. Multiplanar reconstructed images and MIPs were obtained and reviewed to evaluate the vascular anatomy. RADIATION DOSE REDUCTION: This exam was performed according to the departmental dose-optimization program which includes automated exposure control, adjustment of the mA and/or kV according to patient size and/or use of iterative reconstruction technique. CONTRAST:  OMNIPAQUE  IOHEXOL  350 MG/ML SOLN COMPARISON:  CT abdomen pelvis 04/20/2013 FINDINGS: VASCULAR Aorta: Normal caliber aorta without aneurysm, dissection, vasculitis or significant stenosis. Celiac: Mild narrowing of the proximal celiac trunk and the configuration is suggestive for median arcuate ligament compression. Main branches of the celiac trunk are patent. No evidence for aneurysm or dissection. SMA: Patent without evidence of aneurysm, dissection, vasculitis or significant stenosis. Renals: Both renal arteries are patent without evidence of aneurysm, dissection, vasculitis, fibromuscular dysplasia or significant stenosis. IMA: Patent without evidence of aneurysm, dissection, vasculitis or significant stenosis. Inflow: Patent without evidence of aneurysm, dissection, vasculitis or significant stenosis. Proximal Outflow: Proximal femoral  arteries are patent bilaterally. Veins: IVC and iliac veins are patent. Renal veins are patent. Main portal venous system is patent. Prominent venous structures in the pelvis. Review of the MIP images confirms the above findings. NON-VASCULAR Lower chest: Mild motion artifact at the lung bases. No significant consolidation at the lung bases. No pleural effusions. Hepatobiliary: Small hypodensities in liver likely represent hepatic cysts but some are too small to definitively characterize. Normal appearance of the gallbladder. No biliary dilatation. Pancreas: Unremarkable. No pancreatic ductal dilatation or surrounding inflammatory changes. Spleen: Normal in size without focal abnormality. Adrenals/Urinary Tract: Normal adrenal glands. Negative for kidney stones. No hydronephrosis. Normal urinary bladder. No suspicious renal lesions. Stomach/Bowel: Normal stomach. Evidence for contrast extravasation on both the arterial and venous phase imaging in the sigmoid colon located  in the right lower abdomen. This finding is best seen on image 163/10. There is mild colonic wall thickening adjacent to the area of bleeding, best seen on the venous phase imaging, image 66, sequence 16. Normal appearance of the small bowel. Question previous appendectomy. No significant colonic diverticula. Lymphatic: No significant lymph node enlargement in the abdomen and pelvis but there is a mildly prominent lymph node or lymph nodes near the sigmoid colon wall thickening and bleeding on image 72/19 and image 89/20. Reproductive: Focal low-density area in the central aspect of the prostate on image 93/16 and question previous intervention in this area such as the TURP. Prostate size appears to be within normal limits. Other: Right inguinal hernia containing fat. Negative for free fluid. Small umbilical hernia containing fat. Musculoskeletal: No acute bone abnormality. No suspicious osseous lesions. IMPRESSION: 1. Positive for active GI  bleeding in the sigmoid colon. 2. Mild colonic wall thickening adjacent to the area of bleeding. GI bleeding etiology is uncertain. Although this could be associated with a diverticular bleed, cannot exclude an underlying colonic neoplasm. In addition, there is a mildly prominent lymph node near the sigmoid colon that is nonspecific. 3. Small hypodense structures in liver probably represent hepatic cysts. However, depending on the etiology for the GI bleeding, this Thobe need further characterization. 4. Mild narrowing of the proximal celiac trunk and the configuration is suggestive for median arcuate ligament compression. 5. Right inguinal hernia containing fat. Small umbilical hernia containing fat. 6. Focal low-density area in the prostate and question previous surgical intervention in this area. These results were called by telephone at the time of interpretation on 10/16/2023 at 12:47 pm to provider CHRISTOPHER RIGNEY , who verbally acknowledged these results. Electronically Signed   By: Juliene Balder M.D.   On: 10/16/2023 12:49    Alm Schneider, DO  [facility]  If 7PM-7AM, please contact night-coverage [email or web address] Password [email or web address] 10/18/2023, 6:10 PM   LOS: 1 day

## 2023-10-19 DIAGNOSIS — K6389 Other specified diseases of intestine: Secondary | ICD-10-CM | POA: Diagnosis not present

## 2023-10-19 DIAGNOSIS — K921 Melena: Secondary | ICD-10-CM | POA: Diagnosis not present

## 2023-10-19 DIAGNOSIS — K922 Gastrointestinal hemorrhage, unspecified: Secondary | ICD-10-CM | POA: Diagnosis not present

## 2023-10-19 LAB — BASIC METABOLIC PANEL WITH GFR
Anion gap: 8 (ref 5–15)
BUN: 7 mg/dL (ref 6–20)
CO2: 26 mmol/L (ref 22–32)
Calcium: 8.6 mg/dL — ABNORMAL LOW (ref 8.9–10.3)
Chloride: 102 mmol/L (ref 98–111)
Creatinine, Ser: 0.94 mg/dL (ref 0.61–1.24)
GFR, Estimated: >60 mL/min (ref >60)
Glucose, Bld: 110 mg/dL — ABNORMAL HIGH (ref 70–99)
Potassium: 3.6 mmol/L (ref 3.5–5.1)
Sodium: 136 mmol/L (ref 135–145)

## 2023-10-19 LAB — PHOSPHORUS: Phosphorus: 3.8 mg/dL (ref 2.5–4.6)

## 2023-10-19 LAB — CBC
HCT: 31.7 % — ABNORMAL LOW (ref 39.0–52.0)
Hemoglobin: 10.5 g/dL — ABNORMAL LOW (ref 13.0–17.0)
MCH: 29.7 pg (ref 26.0–34.0)
MCHC: 33.1 g/dL (ref 30.0–36.0)
MCV: 89.5 fL (ref 80.0–100.0)
Platelets: 265 10*3/uL (ref 150–400)
RBC: 3.54 MIL/uL — ABNORMAL LOW (ref 4.22–5.81)
RDW: 13.2 % (ref 11.5–15.5)
WBC: 5.9 10*3/uL (ref 4.0–10.5)
nRBC: 0 % (ref 0.0–0.2)

## 2023-10-19 LAB — SURGICAL PCR SCREEN
MRSA, PCR: NEGATIVE
Staphylococcus aureus: NEGATIVE

## 2023-10-19 LAB — MAGNESIUM: Magnesium: 2.1 mg/dL (ref 1.7–2.4)

## 2023-10-19 NOTE — Plan of Care (Signed)

## 2023-10-19 NOTE — Progress Notes (Signed)
 PROGRESS NOTE  William Francis FMW:[id number] DOB: 03-May-1970 DOA: 10/16/2023 PCP: Patient, No Pcp Per  Brief History:  53 year old with no documented chronic medical issues presented from [facility] with hematochezia.  The patient had 3 episodes prior to arrival in the morning of 10/16/2023.  He had some dizziness and nausea with abdominal cramping.  He states that he had noticed some small amounts blood in his stool on 10/15/2023. The patient states that he has had some intermittent hematochezia for at least the last 2 years for which she has not sought any medical care.  However, he states that this episode has been a greater volume.  Denies alcohol, illicit drug use, NSAIDs.SABRA He denies any fevers, chills, chest pain, shortness of breath, coughing hemoptysis.  He denies any dysuria or hematuria.  He denies any melena.  Patient was afebrile with stable with oxygen saturation 97% room air.  WBC 6.4, hemoglobin 14.2, platelets 306.  Sodium 140, potassium 3.9, bicarbonate 26, serum creatinine 0.96.  CTA abd/pelvis showed active GI bleed in the sigmoid colon.  There was mild colonic wall thickening adjacent to the area of the bleeding.  With concern for diverticular bleed versus neoplasm.  There was some mild narrowing of the proximal celiac trunk. EDP spoke with Ahmed  who agrees to consult.   Assessment/Plan: Hematochezia/Lower GI bleed/Acute blood loss anemia - GI consult appreciated - 10/17/23 colonoscopy--sigmoid colon mass with non-bleeding internal hemorrhoids - Typed and screenef - Monitor serial H&H - Clear liquid diet - Hgb down to 10.5 although some is due to dilution -Hgb now stable, no further bleeding   Sigmoid Colon Mass -10/17/23 colonoscopy--sigmoid colon mass with non-bleeding internal hemorrhoids -CT chest--No highly suspicious findings to suggest metastatic disease to the chest; nonspecific liver lesions <1 cm;  largest lesion is in the  right lobe of liver measuring 1.4 x 0.8 cm -general surgery consult appreciated -plans for sigmoidectomy 10/20/23 - CEA pending   Elevated blood pressure - No history of hypertension - Monitor for now   Liver Lesions -MR liver-- Multiple scattered fluid signal liver cysts corresponding to findings of prior CT. No solid lesion or suspicious contrast enhancement;  no concerning LN in abd               Family Communication:  no  Family at bedside   Consultants:  GI, general surgery   Code Status:  FULL    DVT Prophylaxis:  SCDs     Procedures: As Listed in Progress Note Above   Antibiotics: None      Subjective: Patient denies fevers, chills, headache, chest pain, dyspnea, nausea, vomiting, diarrhea, abdominal pain, dysuria, hematuria, hematochezia, and melena.   Objective: Vitals:   10/18/23 1937 10/19/23 0452 10/19/23 1100 10/19/23 1227  BP: (!) 123/110 113/84  (!) 129/99  Pulse: 77 85  72  Resp: 16 16  18   Temp: 98.4 F (36.9 C) 98.1 F (36.7 C)  97.9 F (36.6 C)  TempSrc: Oral Oral  Oral  SpO2: 99% 96%  99%  Weight:   99 kg   Height:        Intake/Output Summary (Last 24 hours) at 10/19/2023 1614 Last data filed at 10/19/2023 0454 Gross per 24 hour  Intake 480 ml  Output --  Net 480 ml   Weight change:  Exam:  General:  Pt is alert, follows commands appropriately, not in acute distress HEENT: No icterus, No thrush, No  neck mass, Gays/AT Cardiovascular: RRR, S1/S2, no rubs, no gallops Respiratory: CTA bilaterally, no wheezing, no crackles, no rhonchi Abdomen: Soft/+BS, non tender, non distended, no guarding Extremities: No edema, No lymphangitis, No petechiae, No rashes, no synovitis   Data Reviewed: I have personally reviewed following labs and imaging studies Basic Metabolic Panel: Recent Labs  Lab 10/16/23 1034 10/17/23 0457 10/19/23 0320  NA 140 139 136  K 3.9 3.6 3.6  CL 104 104 102  CO2 26 29 26   GLUCOSE 109* 112* 110*  BUN 14  12 7   CREATININE 0.96 0.94 0.94  CALCIUM 9.0 8.6* 8.6*  MG  --   --  2.1  PHOS  --   --  3.8   Liver Function Tests: Recent Labs  Lab 10/16/23 1034  AST 19  ALT 21  ALKPHOS 62  BILITOT 0.5  PROT 7.3  ALBUMIN 4.1   Recent Labs  Lab 10/16/23 1034  LIPASE 25   No results for input(s): AMMONIA in the last 168 hours. Coagulation Profile: Recent Labs  Lab 10/17/23 0457  INR 1.1   CBC: Recent Labs  Lab 10/16/23 1034 10/16/23 1650 10/17/23 0457 10/17/23 0942 10/17/23 1618 10/18/23 0502 10/19/23 0320  WBC 6.4  --   --   --   --  7.1 5.9  NEUTROABS 2.8  --   --   --   --   --   --   HGB 14.2   < > 11.7* 11.8* 10.9* 10.8* 10.5*  HCT 40.8   < > 33.8* 34.0* 31.5* 31.0* 31.7*  MCV 88.7  --   --   --   --  89.3 89.5  PLT 306  --   --   --   --  258 265   < > = values in this interval not displayed.   Cardiac Enzymes: No results for input(s): CKTOTAL, CKMB, CKMBINDEX, TROPONINI in the last 168 hours. BNP: Invalid input(s): POCBNP CBG: No results for input(s): GLUCAP in the last 168 hours. HbA1C: No results for input(s): HGBA1C in the last 72 hours. Urine analysis:    Component Value Date/Time   COLORURINE YELLOW 10/16/2023 1016   APPEARANCEUR CLEAR 10/16/2023 1016   LABSPEC 1.023 10/16/2023 1016   PHURINE 5.0 10/16/2023 1016   GLUCOSEU NEGATIVE 10/16/2023 1016   HGBUR SMALL (A) 10/16/2023 1016   BILIRUBINUR NEGATIVE 10/16/2023 1016   KETONESUR NEGATIVE 10/16/2023 1016   PROTEINUR NEGATIVE 10/16/2023 1016   UROBILINOGEN 0.2 04/20/2013 1424   NITRITE NEGATIVE 10/16/2023 1016   LEUKOCYTESUR NEGATIVE 10/16/2023 1016   Sepsis Labs: @LABRCNTIP (procalcitonin:4,lacticidven:4) )No results found for this or any previous visit (from the past 240 hours).   Scheduled Meds:  metroNIDAZOLE  1,000 mg Oral Once   Followed by   metroNIDAZOLE  1,000 mg Oral Once   neomycin  1,000 mg Oral Once   Followed by   neomycin  1,000 mg Oral Once   Continuous  Infusions:  Procedures/Studies: MR ABDOMEN W WO CONTRAST Result Date: 10/18/2023 CLINICAL DATA:  Liver lesions suspected by prior CT, new diagnosis colon cancer EXAM: MRI ABDOMEN WITHOUT AND WITH CONTRAST TECHNIQUE: Multiplanar multisequence MR imaging of the abdomen was performed both before and after the administration of intravenous contrast. CONTRAST:  9mL GADAVIST GADOBUTROL 1 MMOL/ML IV SOLN COMPARISON:  CT abdomen pelvis, 10/16/2023 FINDINGS: Lower chest: No acute abnormality. Hepatobiliary: No solid liver abnormality is seen. Multiple scattered fluid signal liver cysts. No solid lesion or suspicious contrast enhancement. No gallstones, gallbladder wall thickening,  or biliary dilatation. Pancreas: Unremarkable. No pancreatic ductal dilatation or surrounding inflammatory changes. Spleen: Normal in size without significant abnormality. Adrenals/Urinary Tract: Adrenal glands are unremarkable. Kidneys are normal, without renal calculi, solid lesion, or hydronephrosis. Stomach/Bowel: Stomach is within normal limits. Sigmoid colon mass is partially included on selected coronal sequences (series 3, image 25). Vascular/Lymphatic: No significant vascular findings are present. No enlarged abdominal lymph nodes. Other: No abdominal wall hernia or abnormality. No ascites. Musculoskeletal: No acute or significant osseous findings. IMPRESSION: 1. Multiple scattered fluid signal liver cysts corresponding to findings of prior CT. No solid lesion or suspicious contrast enhancement. 2. No evidence of lymphadenopathy or metastatic disease in the abdomen. 3. Known sigmoid colon mass is partially included on selected coronal sequences. Electronically Signed   By: Marolyn JONETTA Jaksch M.D.   On: 10/18/2023 20:26   CT CHEST W CONTRAST Result Date: 10/18/2023 CLINICAL DATA:  Colon cancer.  Staging. * Tracking Code: BO * EXAM: CT CHEST WITH CONTRAST TECHNIQUE: Multidetector CT imaging of the chest was performed during intravenous  contrast administration. RADIATION DOSE REDUCTION: This exam was performed according to the departmental dose-optimization program which includes automated exposure control, adjustment of the mA and/or kV according to patient size and/or use of iterative reconstruction technique. CONTRAST:  75mL OMNIPAQUE  IOHEXOL  300 MG/ML  SOLN COMPARISON:  None Available. FINDINGS: Cardiovascular: Heart size is normal. No pericardial effusion. No significant aortic or coronary artery atherosclerotic calcifications. Mediastinum/Nodes: No enlarged mediastinal, hilar, or axillary lymph nodes. Thyroid gland, trachea, and esophagus demonstrate no significant findings. Lungs/Pleura: No pleural effusion, airspace consolidation, atelectasis, or pneumothorax. The central airways are grossly patent. Tiny nodule within the right upper lobe measures 3 mm, image 49/3. In the posterior right lower lobe there is a 2 mm nodule, image 23/3. Upper Abdomen: No acute abnormality. Multiple scattered low-attenuation lesions within both lobes of liver. The largest lesion is in the right lobe of liver measuring 1.4 x 0.8 cm and fluid attenuation, image 163/2. The remaining are less than 1 cm in technically too small to characterize. Musculoskeletal: No chest wall abnormality. No acute or significant osseous findings. IMPRESSION: 1. No acute cardiopulmonary abnormalities. No highly suspicious findings to suggest metastatic disease to the chest. 2. There are 2 tiny nodules within the right lung measuring up to 3 mm. These are nonspecific. Attention on future surveillance imaging is advised in this patient with newly diagnosed colon cancer. 3. Multiple scattered low-attenuation lesions within both lobes of liver. Most of these are less than 1 cm and are technically too small to reliably characterize. The largest lesion is in the right lobe of liver measuring 1.4 x 0.8 cm. These are favored to represent benign cysts or hemangiomas. In a patient with newly  diagnosed colon cancer consider more definitive characterization of the subcentimeter lesions with nonemergent dedicated abdominal MRI with and without contrast. Electronically Signed   By: Waddell Calk M.D.   On: 10/18/2023 04:41   CT Angio Abd/Pel W and/or Wo Contrast Result Date: 10/16/2023 CLINICAL DATA:  Lower GI bleed.  Hematochezia. EXAM: CTA ABDOMEN AND PELVIS WITHOUT AND WITH CONTRAST TECHNIQUE: Multidetector CT imaging of the abdomen and pelvis was performed using the standard protocol during bolus administration of intravenous contrast. Multiplanar reconstructed images and MIPs were obtained and reviewed to evaluate the vascular anatomy. RADIATION DOSE REDUCTION: This exam was performed according to the departmental dose-optimization program which includes automated exposure control, adjustment of the mA and/or kV according to patient size and/or use of iterative reconstruction  technique. CONTRAST:  OMNIPAQUE  IOHEXOL  350 MG/ML SOLN COMPARISON:  CT abdomen pelvis 04/20/2013 FINDINGS: VASCULAR Aorta: Normal caliber aorta without aneurysm, dissection, vasculitis or significant stenosis. Celiac: Mild narrowing of the proximal celiac trunk and the configuration is suggestive for median arcuate ligament compression. Main branches of the celiac trunk are patent. No evidence for aneurysm or dissection. SMA: Patent without evidence of aneurysm, dissection, vasculitis or significant stenosis. Renals: Both renal arteries are patent without evidence of aneurysm, dissection, vasculitis, fibromuscular dysplasia or significant stenosis. IMA: Patent without evidence of aneurysm, dissection, vasculitis or significant stenosis. Inflow: Patent without evidence of aneurysm, dissection, vasculitis or significant stenosis. Proximal Outflow: Proximal femoral arteries are patent bilaterally. Veins: IVC and iliac veins are patent. Renal veins are patent. Main portal venous system is patent. Prominent venous structures  in the pelvis. Review of the MIP images confirms the above findings. NON-VASCULAR Lower chest: Mild motion artifact at the lung bases. No significant consolidation at the lung bases. No pleural effusions. Hepatobiliary: Small hypodensities in liver likely represent hepatic cysts but some are too small to definitively characterize. Normal appearance of the gallbladder. No biliary dilatation. Pancreas: Unremarkable. No pancreatic ductal dilatation or surrounding inflammatory changes. Spleen: Normal in size without focal abnormality. Adrenals/Urinary Tract: Normal adrenal glands. Negative for kidney stones. No hydronephrosis. Normal urinary bladder. No suspicious renal lesions. Stomach/Bowel: Normal stomach. Evidence for contrast extravasation on both the arterial and venous phase imaging in the sigmoid colon located in the right lower abdomen. This finding is best seen on image 163/10. There is mild colonic wall thickening adjacent to the area of bleeding, best seen on the venous phase imaging, image 66, sequence 16. Normal appearance of the small bowel. Question previous appendectomy. No significant colonic diverticula. Lymphatic: No significant lymph node enlargement in the abdomen and pelvis but there is a mildly prominent lymph node or lymph nodes near the sigmoid colon wall thickening and bleeding on image 72/19 and image 89/20. Reproductive: Focal low-density area in the central aspect of the prostate on image 93/16 and question previous intervention in this area such as the TURP. Prostate size appears to be within normal limits. Other: Right inguinal hernia containing fat. Negative for free fluid. Small umbilical hernia containing fat. Musculoskeletal: No acute bone abnormality. No suspicious osseous lesions. IMPRESSION: 1. Positive for active GI bleeding in the sigmoid colon. 2. Mild colonic wall thickening adjacent to the area of bleeding. GI bleeding etiology is uncertain. Although this could be associated  with a diverticular bleed, cannot exclude an underlying colonic neoplasm. In addition, there is a mildly prominent lymph node near the sigmoid colon that is nonspecific. 3. Small hypodense structures in liver probably represent hepatic cysts. However, depending on the etiology for the GI bleeding, this L[place]and need further characterization. 4. Mild narrowing of the proximal celiac trunk and the configuration is suggestive for median arcuate ligament compression. 5. Right inguinal hernia containing fat. Small umbilical hernia containing fat. 6. Focal low-density area in the prostate and question previous surgical intervention in this area. These results were called by telephone at the time of interpretation on 10/16/2023 at 12:47 pm to provider CHRISTOPHER RIGNEY , who verbally acknowledged these results. Electronically Signed   By: Juliene Balder M.D.   On: 10/16/2023 12:49    Alm Schneider, DO  [facility]  If 7PM-7AM, please contact night-coverage [email or web address] Password [email or web address] 10/19/2023, 4:14 PM   LOS: 2 days

## 2023-10-19 NOTE — Progress Notes (Signed)
 [facility] Progress Note  2 Days Post-Op  Subjective: Patient seen and examined.  He is resting comfortably in bed.  He is tolerating his clear liquids without nausea and vomiting, and denies any further bloody bowel movements.  He denies abdominal pain.  He has no questions regarding surgery tomorrow.  Objective: Vital signs in last 24 hours: Temp:  [98.1 F (36.7 C)-98.6 F (37 C)] 98.1 F (36.7 C) (06/29 0452) Pulse Rate:  [77-90] 85 (06/29 0452) Resp:  [16-17] 16 (06/29 0452) BP: (106-123)/(75-110) 113/84 (06/29 0452) SpO2:  [95 %-99 %] 96 % (06/29 0452) Last BM Date : 10/17/23  Intake/Output from previous day: 06/28 0701 - 06/29 0700 In: 960 [P.O.:960] Out: -  Intake/Output this shift: No intake/output data recorded.  General appearance: alert, cooperative, and no distress GI: Abdomen soft, nondistended, no percussion tenderness, nontender to palpation; no rigidity, guarding, rebound tenderness  Lab Results:  Recent Labs    10/18/23 0502 10/19/23 0320  WBC 7.1 5.9  HGB 10.8* 10.5*  HCT 31.0* 31.7*  PLT 258 265   BMET Recent Labs    10/17/23 0457 10/19/23 0320  NA 139 136  K 3.6 3.6  CL 104 102  CO2 29 26  GLUCOSE 112* 110*  BUN 12 7  CREATININE 0.94 0.94  CALCIUM 8.6* 8.6*   PT/INR Recent Labs    10/17/23 0457  LABPROT 14.4  INR 1.1    Studies/Results: MR ABDOMEN W WO CONTRAST Result Date: 10/18/2023 CLINICAL DATA:  Liver lesions suspected by prior CT, new diagnosis colon cancer EXAM: MRI ABDOMEN WITHOUT AND WITH CONTRAST TECHNIQUE: Multiplanar multisequence MR imaging of the abdomen was performed both before and after the administration of intravenous contrast. CONTRAST:  9mL GADAVIST GADOBUTROL 1 MMOL/ML IV SOLN COMPARISON:  CT abdomen pelvis, 10/16/2023 FINDINGS: Lower chest: No acute abnormality. Hepatobiliary: No solid liver abnormality is seen. Multiple scattered fluid signal liver cysts. No solid lesion or suspicious  contrast enhancement. No gallstones, gallbladder wall thickening, or biliary dilatation. Pancreas: Unremarkable. No pancreatic ductal dilatation or surrounding inflammatory changes. Spleen: Normal in size without significant abnormality. Adrenals/Urinary Tract: Adrenal glands are unremarkable. Kidneys are normal, without renal calculi, solid lesion, or hydronephrosis. Stomach/Bowel: Stomach is within normal limits. Sigmoid colon mass is partially included on selected coronal sequences (series 3, image 25). Vascular/Lymphatic: No significant vascular findings are present. No enlarged abdominal lymph nodes. Other: No abdominal wall hernia or abnormality. No ascites. Musculoskeletal: No acute or significant osseous findings. IMPRESSION: 1. Multiple scattered fluid signal liver cysts corresponding to findings of prior CT. No solid lesion or suspicious contrast enhancement. 2. No evidence of lymphadenopathy or metastatic disease in the abdomen. 3. Known sigmoid colon mass is partially included on selected coronal sequences. Electronically Signed   By: Marolyn JONETTA Jaksch M.D.   On: 10/18/2023 20:26   CT CHEST W CONTRAST Result Date: 10/18/2023 CLINICAL DATA:  Colon cancer.  Staging. * Tracking Code: BO * EXAM: CT CHEST WITH CONTRAST TECHNIQUE: Multidetector CT imaging of the chest was performed during intravenous contrast administration. RADIATION DOSE REDUCTION: This exam was performed according to the departmental dose-optimization program which includes automated exposure control, adjustment of the mA and/or kV according to patient size and/or use of iterative reconstruction technique. CONTRAST:  75mL OMNIPAQUE  IOHEXOL  300 MG/ML  SOLN COMPARISON:  None Available. FINDINGS: Cardiovascular: Heart size is normal. No pericardial effusion. No significant aortic or coronary artery atherosclerotic calcifications. Mediastinum/Nodes: No enlarged mediastinal, hilar, or axillary lymph nodes. Thyroid gland, trachea, and esophagus  demonstrate no significant findings. Lungs/Pleura: No pleural effusion, airspace consolidation, atelectasis, or pneumothorax. The central airways are grossly patent. Tiny nodule within the right upper lobe measures 3 mm, image 49/3. In the posterior right lower lobe there is a 2 mm nodule, image 23/3. Upper Abdomen: No acute abnormality. Multiple scattered low-attenuation lesions within both lobes of liver. The largest lesion is in the right lobe of liver measuring 1.4 x 0.8 cm and fluid attenuation, image 163/2. The remaining are less than 1 cm in technically too small to characterize. Musculoskeletal: No chest wall abnormality. No acute or significant osseous findings. IMPRESSION: 1. No acute cardiopulmonary abnormalities. No highly suspicious findings to suggest metastatic disease to the chest. 2. There are 2 tiny nodules within the right lung measuring up to 3 mm. These are nonspecific. Attention on future surveillance imaging is advised in this patient with newly diagnosed colon cancer. 3. Multiple scattered low-attenuation lesions within both lobes of liver. Most of these are less than 1 cm and are technically too small to reliably characterize. The largest lesion is in the right lobe of liver measuring 1.4 x 0.8 cm. These are favored to represent benign cysts or hemangiomas. In a patient with newly diagnosed colon cancer consider more definitive characterization of the subcentimeter lesions with nonemergent dedicated abdominal MRI with and without contrast. Electronically Signed   By: Waddell Calk M.D.   On: 10/18/2023 04:41    Anti-infectives: Anti-infectives (From admission, onward)    Start     Dose/Rate Route Frequency Ordered Stop   10/19/23 2200  metroNIDAZOLE (FLAGYL) tablet 1,000 mg       Placed in Followed by Linked Group   1,000 mg Oral  Once 10/18/23 1216     10/19/23 2200  neomycin (MYCIFRADIN) tablet 1,000 mg       Placed in Followed by Linked Group   1,000 mg Oral  Once  10/18/23 1216     10/19/23 1500  metroNIDAZOLE (FLAGYL) tablet 1,000 mg       Placed in Followed by Linked Group   1,000 mg Oral  Once 10/18/23 1216     10/19/23 1500  neomycin (MYCIFRADIN) tablet 1,000 mg       Placed in Followed by Linked Group   1,000 mg Oral  Once 10/18/23 1216     10/19/23 1400  metroNIDAZOLE (FLAGYL) tablet 1,000 mg       Placed in Followed by Linked Group   1,000 mg Oral  Once 10/18/23 1216     10/19/23 1400  neomycin (MYCIFRADIN) tablet 1,000 mg       Placed in Followed by Linked Group   1,000 mg Oral  Once 10/18/23 1216         Assessment/Plan:  Patient is a 53 year old male who was admitted with hematochezia and found to have a sigmoid colon mass on colonoscopy.   - Plan for sigmoidectomy tomorrow afternoon - Continue clear liquids today, NPO at midnight - Antibiotic prep ordered for the patient today - PRN pain control and antiemetics - Liver MRI yesterday demonstrates no concern for metastatic disease - CEA in process - Recommend oncology evaluate the patient prior to discharge given his incarceration status - Appreciate hospitalist recommendations   LOS: 2 days    Jancarlos Thrun A Tywaun Hiltner 10/19/2023  Note: Portions of this report [place] have been transcribed using voice recognition software. Every effort has been made to ensure accuracy; however, inadvertent computerized transcription errors Daza still be present.

## 2023-10-19 NOTE — Plan of Care (Signed)

## 2023-10-19 NOTE — Plan of Care (Signed)
   Problem: Education: Goal: Knowledge of General Education information will improve Description: Including pain rating scale, medication(s)/side effects and non-pharmacologic comfort measures Outcome: Progressing   Problem: Health Behavior/Discharge Planning: Goal: Ability to manage health-related needs will improve Outcome: Progressing   Problem: Clinical Measurements: Goal: Diagnostic test results will improve Outcome: Progressing

## 2023-10-20 ENCOUNTER — Encounter (HOSPITAL_COMMUNITY): Payer: Self-pay | Admitting: Internal Medicine

## 2023-10-20 ENCOUNTER — Inpatient Hospital Stay (HOSPITAL_COMMUNITY)

## 2023-10-20 ENCOUNTER — Encounter (HOSPITAL_COMMUNITY): Admission: EM | Payer: Self-pay | Attending: Internal Medicine

## 2023-10-20 DIAGNOSIS — K921 Melena: Secondary | ICD-10-CM | POA: Diagnosis not present

## 2023-10-20 DIAGNOSIS — K922 Gastrointestinal hemorrhage, unspecified: Secondary | ICD-10-CM | POA: Diagnosis not present

## 2023-10-20 DIAGNOSIS — K6389 Other specified diseases of intestine: Secondary | ICD-10-CM

## 2023-10-20 DIAGNOSIS — C189 Malignant neoplasm of colon, unspecified: Secondary | ICD-10-CM

## 2023-10-20 HISTORY — PX: PARTIAL COLECTOMY: SHX5273

## 2023-10-20 LAB — CEA: CEA: 1.2 ng/mL (ref 0.0–4.7)

## 2023-10-20 SURGERY — COLECTOMY, PARTIAL
Anesthesia: General | Site: Abdomen

## 2023-10-20 MED ORDER — OXYCODONE HCL 5 MG PO TABS: 5.0000 mg | ORAL_TABLET | Freq: Once | ORAL | Status: DC | PRN

## 2023-10-20 MED ORDER — PROPOFOL 10 MG/ML IV BOLUS
INTRAVENOUS | Status: AC
Start: 2023-10-20 — End: 2023-10-20
  Filled 2023-10-20: qty 20

## 2023-10-20 MED ORDER — SODIUM CHLORIDE 0.9 % IV SOLN
1.0000 g | INTRAVENOUS | Status: AC
  Administered 2023-10-20: 1 g via INTRAVENOUS
  Filled 2023-10-20: qty 1000

## 2023-10-20 MED ORDER — PROPOFOL 10 MG/ML IV BOLUS
INTRAVENOUS | Status: AC
  Filled 2023-10-20: qty 20

## 2023-10-20 MED ORDER — HYDROMORPHONE HCL 1 MG/ML IJ SOLN: 0.5000 mg | INTRAMUSCULAR | Status: DC | PRN

## 2023-10-20 MED ORDER — CLINDAMYCIN PHOSPHATE 900 MG/50ML IV SOLN
900.0000 mg | Freq: Three times a day (TID) | INTRAVENOUS | Status: AC
  Administered 2023-10-20: 900 mg via INTRAVENOUS
  Filled 2023-10-20: qty 50

## 2023-10-20 MED ORDER — ORAL CARE MOUTH RINSE: 15.0000 mL | Freq: Once | OROMUCOSAL | Status: DC

## 2023-10-20 MED ORDER — 0.9 % SODIUM CHLORIDE (POUR BTL) OPTIME
TOPICAL | Status: DC | PRN
  Administered 2023-10-20 (×2): 1000 mL

## 2023-10-20 MED ORDER — ALVIMOPAN 12 MG PO CAPS
12.0000 mg | ORAL_CAPSULE | ORAL | Status: AC
  Administered 2023-10-20: 12 mg via ORAL
  Filled 2023-10-20: qty 1

## 2023-10-20 MED ORDER — HYDROMORPHONE HCL 1 MG/ML IJ SOLN
INTRAMUSCULAR | Status: AC
  Filled 2023-10-20: qty 1

## 2023-10-20 MED ORDER — DEXAMETHASONE SODIUM PHOSPHATE 10 MG/ML IJ SOLN
INTRAMUSCULAR | Status: AC
  Filled 2023-10-20: qty 1

## 2023-10-20 MED ORDER — FENTANYL CITRATE (PF) 100 MCG/2ML IJ SOLN
INTRAMUSCULAR | Status: AC
Start: 2023-10-20 — End: 2023-10-20
  Filled 2023-10-20: qty 2

## 2023-10-20 MED ORDER — HYDROMORPHONE HCL 1 MG/ML IJ SOLN
0.2500 mg | INTRAMUSCULAR | Status: DC | PRN
  Administered 2023-10-20: 0.5 mg via INTRAVENOUS
  Filled 2023-10-20: qty 0.5

## 2023-10-20 MED ORDER — ONDANSETRON HCL 4 MG/2ML IJ SOLN
INTRAMUSCULAR | Status: AC
  Filled 2023-10-20: qty 2

## 2023-10-20 MED ORDER — SODIUM CHLORIDE 0.9 % IV SOLN: INTRAVENOUS | Status: DC

## 2023-10-20 MED ORDER — ENSURE PRE-SURGERY PO LIQD
592.0000 mL | Freq: Once | ORAL | Status: DC
  Filled 2023-10-20: qty 592

## 2023-10-20 MED ORDER — ENSURE SURGERY PO LIQD
237.0000 mL | Freq: Two times a day (BID) | ORAL | Status: DC
  Administered 2023-10-21 – 2023-10-22 (×3): 237 mL via ORAL

## 2023-10-20 MED ORDER — FENTANYL CITRATE (PF) 100 MCG/2ML IJ SOLN
INTRAMUSCULAR | Status: AC
  Filled 2023-10-20: qty 2

## 2023-10-20 MED ORDER — CHLORHEXIDINE GLUCONATE CLOTH 2 % EX PADS
6.0000 | MEDICATED_PAD | Freq: Once | CUTANEOUS | Status: AC
  Administered 2023-10-20: 6 via TOPICAL

## 2023-10-20 MED ORDER — ENOXAPARIN SODIUM 40 MG/0.4ML IJ SOSY
40.0000 mg | PREFILLED_SYRINGE | INTRAMUSCULAR | Status: DC
  Administered 2023-10-21 – 2023-10-22 (×2): 40 mg via SUBCUTANEOUS
  Filled 2023-10-20 (×3): qty 0.4

## 2023-10-20 MED ORDER — ALVIMOPAN 12 MG PO CAPS
12.0000 mg | ORAL_CAPSULE | Freq: Two times a day (BID) | ORAL | Status: DC
  Administered 2023-10-21 – 2023-10-22 (×3): 12 mg via ORAL
  Filled 2023-10-20 (×3): qty 1

## 2023-10-20 MED ORDER — ROCURONIUM BROMIDE 10 MG/ML (PF) SYRINGE
PREFILLED_SYRINGE | INTRAVENOUS | Status: DC | PRN
  Administered 2023-10-20: 50 mg via INTRAVENOUS
  Administered 2023-10-20: 70 mg via INTRAVENOUS

## 2023-10-20 MED ORDER — MIDAZOLAM HCL 2 MG/2ML IJ SOLN
INTRAMUSCULAR | Status: AC
Start: 2023-10-20 — End: 2023-10-20
  Filled 2023-10-20: qty 2

## 2023-10-20 MED ORDER — SUGAMMADEX SODIUM 200 MG/2ML IV SOLN
INTRAVENOUS | Status: DC | PRN
  Administered 2023-10-20: 200 mg via INTRAVENOUS

## 2023-10-20 MED ORDER — DEXAMETHASONE SODIUM PHOSPHATE 10 MG/ML IJ SOLN
INTRAMUSCULAR | Status: DC | PRN
  Administered 2023-10-20: 10 mg via INTRAVENOUS

## 2023-10-20 MED ORDER — LACTATED RINGERS IV SOLN: INTRAVENOUS | Status: DC

## 2023-10-20 MED ORDER — OXYCODONE HCL 5 MG/5ML PO SOLN: 5.0000 mg | Freq: Once | ORAL | Status: DC | PRN

## 2023-10-20 MED ORDER — ROCURONIUM BROMIDE 10 MG/ML (PF) SYRINGE
PREFILLED_SYRINGE | INTRAVENOUS | Status: AC
  Filled 2023-10-20: qty 10

## 2023-10-20 MED ORDER — ONDANSETRON HCL 4 MG/2ML IJ SOLN
4.0000 mg | Freq: Four times a day (QID) | INTRAMUSCULAR | Status: DC | PRN
  Administered 2023-10-20: 4 mg via INTRAVENOUS
  Filled 2023-10-20: qty 2

## 2023-10-20 MED ORDER — FENTANYL CITRATE (PF) 100 MCG/2ML IJ SOLN
INTRAMUSCULAR | Status: DC | PRN
  Administered 2023-10-20: 50 ug via INTRAVENOUS
  Administered 2023-10-20: 100 ug via INTRAVENOUS
  Administered 2023-10-20: 50 ug via INTRAVENOUS
  Administered 2023-10-20: 100 ug via INTRAVENOUS

## 2023-10-20 MED ORDER — LIDOCAINE 2% (20 MG/ML) 5 ML SYRINGE
INTRAMUSCULAR | Status: DC | PRN
Start: 2023-10-20 — End: 2023-10-20
  Administered 2023-10-20: 60 mg via INTRAVENOUS

## 2023-10-20 MED ORDER — ONDANSETRON HCL 4 MG/2ML IJ SOLN
INTRAMUSCULAR | Status: DC | PRN
  Administered 2023-10-20: 4 mg via INTRAVENOUS

## 2023-10-20 MED ORDER — HYDROMORPHONE HCL 1 MG/ML IJ SOLN
INTRAMUSCULAR | Status: DC | PRN
  Administered 2023-10-20: 1 mg via INTRAVENOUS
  Administered 2023-10-20: .5 mg via INTRAVENOUS

## 2023-10-20 MED ORDER — CELECOXIB 100 MG PO CAPS
200.0000 mg | ORAL_CAPSULE | ORAL | Status: AC
  Administered 2023-10-20: 200 mg via ORAL
  Filled 2023-10-20: qty 2

## 2023-10-20 MED ORDER — DEXMEDETOMIDINE HCL IN NACL 80 MCG/20ML IV SOLN
INTRAVENOUS | Status: DC | PRN
  Administered 2023-10-20: 20 ug via INTRAVENOUS

## 2023-10-20 MED ORDER — CHLORHEXIDINE GLUCONATE 0.12 % MT SOLN: 15.0000 mL | Freq: Once | OROMUCOSAL | Status: DC

## 2023-10-20 MED ORDER — MIDAZOLAM HCL 2 MG/2ML IJ SOLN
INTRAMUSCULAR | Status: DC | PRN
  Administered 2023-10-20: 2 mg via INTRAVENOUS

## 2023-10-20 MED ORDER — BUPIVACAINE HCL (PF) 0.5 % IJ SOLN
INTRAMUSCULAR | Status: AC
  Filled 2023-10-20: qty 30

## 2023-10-20 MED ORDER — PROPOFOL 10 MG/ML IV BOLUS
INTRAVENOUS | Status: DC | PRN
  Administered 2023-10-20: 40 mg via INTRAVENOUS
  Administered 2023-10-20: 200 mg via INTRAVENOUS

## 2023-10-20 MED ORDER — METHOCARBAMOL 500 MG PO TABS
500.0000 mg | ORAL_TABLET | Freq: Three times a day (TID) | ORAL | Status: DC
  Administered 2023-10-20 – 2023-10-23 (×9): 500 mg via ORAL
  Filled 2023-10-20 (×9): qty 1

## 2023-10-20 MED ORDER — ACETAMINOPHEN 500 MG PO TABS
1000.0000 mg | ORAL_TABLET | ORAL | Status: AC
  Administered 2023-10-20: 1000 mg via ORAL
  Filled 2023-10-20: qty 2

## 2023-10-20 MED ORDER — ACETAMINOPHEN 500 MG PO TABS
1000.0000 mg | ORAL_TABLET | Freq: Four times a day (QID) | ORAL | Status: DC
  Administered 2023-10-20 – 2023-10-22 (×9): 1000 mg via ORAL
  Filled 2023-10-20 (×11): qty 2

## 2023-10-20 MED ORDER — ENSURE PRE-SURGERY PO LIQD
296.0000 mL | Freq: Once | ORAL | Status: DC
  Filled 2023-10-20: qty 296

## 2023-10-20 MED ORDER — GABAPENTIN 300 MG PO CAPS
300.0000 mg | ORAL_CAPSULE | Freq: Two times a day (BID) | ORAL | Status: DC
  Administered 2023-10-20 – 2023-10-22 (×4): 300 mg via ORAL
  Filled 2023-10-20 (×4): qty 1

## 2023-10-20 MED ORDER — OXYCODONE HCL 5 MG PO TABS
5.0000 mg | ORAL_TABLET | ORAL | Status: DC | PRN
  Administered 2023-10-20 – 2023-10-21 (×4): 10 mg via ORAL
  Administered 2023-10-22 – 2023-10-23 (×2): 5 mg via ORAL
  Filled 2023-10-20 (×4): qty 2
  Filled 2023-10-20 (×2): qty 1

## 2023-10-20 MED ORDER — ENOXAPARIN SODIUM 40 MG/0.4ML IJ SOSY
40.0000 mg | PREFILLED_SYRINGE | Freq: Once | INTRAMUSCULAR | Status: AC
  Administered 2023-10-20: 40 mg via SUBCUTANEOUS
  Filled 2023-10-20: qty 0.4

## 2023-10-20 MED ORDER — ONDANSETRON HCL 4 MG PO TABS: 4.0000 mg | ORAL_TABLET | Freq: Four times a day (QID) | ORAL | Status: DC | PRN

## 2023-10-20 MED ORDER — HYDROMORPHONE HCL 1 MG/ML IJ SOLN
INTRAMUSCULAR | Status: AC
  Filled 2023-10-20: qty 0.5

## 2023-10-20 MED ORDER — BUPIVACAINE HCL (PF) 0.5 % IJ SOLN
INTRAMUSCULAR | Status: DC | PRN
  Administered 2023-10-20: 30 mL

## 2023-10-20 MED ORDER — CELECOXIB 100 MG PO CAPS
200.0000 mg | ORAL_CAPSULE | Freq: Two times a day (BID) | ORAL | Status: DC
  Administered 2023-10-20 – 2023-10-23 (×6): 200 mg via ORAL
  Filled 2023-10-20 (×6): qty 2

## 2023-10-20 MED ORDER — PHENYLEPHRINE HCL-NACL 20-0.9 MG/250ML-% IV SOLN
INTRAVENOUS | Status: AC
  Filled 2023-10-20: qty 250

## 2023-10-20 SURGICAL SUPPLY — 41 items
BAG HAMPER (MISCELLANEOUS) ×1 IMPLANT
COUNTER NDL MAGNETIC 40 RED (SET/KITS/TRAYS/PACK) ×1 IMPLANT
COUNTER NEEDLE MAGNETIC 40 RED (SET/KITS/TRAYS/PACK) ×1 IMPLANT
COVER LIGHT HANDLE (MISCELLANEOUS) IMPLANT
DRSG OPSITE POSTOP 4X8 (GAUZE/BANDAGES/DRESSINGS) IMPLANT
DRSG TEGADERM 4X4.75 (GAUZE/BANDAGES/DRESSINGS) IMPLANT
ELECTRODE REM PT RTRN 9FT ADLT (ELECTROSURGICAL) ×1 IMPLANT
GAUZE 4X4 16PLY ~~LOC~~+RFID DBL (SPONGE) IMPLANT
GLOVE BIO SURGEON STRL SZ 6.5 (GLOVE) IMPLANT
GLOVE BIOGEL PI IND STRL 6.5 (GLOVE) ×1 IMPLANT
GLOVE BIOGEL PI IND STRL 7.0 (GLOVE) ×2 IMPLANT
GLOVE BIOGEL PI IND STRL 7.5 (GLOVE) IMPLANT
GLOVE SURG SS PI 6.5 STRL IVOR (GLOVE) ×3 IMPLANT
GLOVE SURG SS PI 7.5 STRL IVOR (GLOVE) IMPLANT
GOWN STRL REUS W/TWL LRG LVL3 (GOWN DISPOSABLE) ×6 IMPLANT
INST SET MAJOR GENERAL (KITS) ×1 IMPLANT
KIT TURNOVER KIT A (KITS) ×1 IMPLANT
LIGASURE IMPACT 36 18CM CVD LR (INSTRUMENTS) ×1 IMPLANT
MANIFOLD NEPTUNE II (INSTRUMENTS) ×1 IMPLANT
MARKER SKIN DUAL TIP RULER LAB (MISCELLANEOUS) IMPLANT
NDL HYPO 21X1.5 SAFETY (NEEDLE) ×1 IMPLANT
NEEDLE HYPO 21X1.5 SAFETY (NEEDLE) ×1 IMPLANT
NS IRRIG 1000ML POUR BTL (IV SOLUTION) ×2 IMPLANT
PACK COLON (CUSTOM PROCEDURE TRAY) ×1 IMPLANT
PAD ARMBOARD POSITIONER FOAM (MISCELLANEOUS) ×1 IMPLANT
PENCIL HANDSWITCHING (ELECTRODE) IMPLANT
PENCIL SMOKE EVACUATOR COATED (MISCELLANEOUS) ×1 IMPLANT
POSITIONER HEAD 8X9X4 ADT (SOFTGOODS) ×1 IMPLANT
RELOAD PROXIMATE 75MM BLUE (ENDOMECHANICALS) ×2 IMPLANT
RELOAD STAPLE 75 3.8 BLU REG (ENDOMECHANICALS) IMPLANT
RETRACTOR WND ALEXIS-O 25 LRG (MISCELLANEOUS) IMPLANT
SHEET LAVH (DRAPES) IMPLANT
SPONGE T-LAP 18X18 ~~LOC~~+RFID (SPONGE) ×3 IMPLANT
STAPLER GUN LINEAR PROX 60 (STAPLE) ×1 IMPLANT
STAPLER PROXIMATE 75MM BLUE (STAPLE) IMPLANT
STAPLER VISISTAT (STAPLE) ×1 IMPLANT
SUT PDS AB CT VIOLET #0 27IN (SUTURE) ×2 IMPLANT
SUT SILK 3 0 SH CR/8 (SUTURE) ×1 IMPLANT
SYR 30ML LL (SYRINGE) ×1 IMPLANT
TRAY FOLEY MTR SLVR 16FR STAT (SET/KITS/TRAYS/PACK) ×1 IMPLANT
YANKAUER SUCT BULB TIP 10FT TU (MISCELLANEOUS) ×1 IMPLANT

## 2023-10-20 NOTE — Op Note (Addendum)
 [facility] Operative Note  10/20/23  Preoperative Diagnosis: Sigmoid colon mass   Postoperative Diagnosis: Same   Procedure(s) Performed: Exploratory laparotomy with sigmoidectomy   Surgeon: Dorothyann Brittle, DO    Assistants: Manuelita Pander, MD    Anesthesia: General endotracheal   Anesthesiologist: Herschell Hollering, MD    Specimens: Sigmoid colon, stitch marking proximal; anastomosis staple line   Estimated Blood Loss: 40 cc   Blood Replacement: None    Complications: None   Wound Class: Clean contaminated   Operative Indications: Patient is a 53 year old male who presented to the hospital with hematochezia.  He underwent a colonoscopy which demonstrated a colonic mass within the sigmoid colon with active oozing.  Decision was made to proceed with colectomy while inpatient.  He underwent a CT of the chest, abdomen, and pelvis which demonstrated no evidence of metastatic disease.  There were some benign appearing cysts within the liver, so a liver MRI was obtained to further characterize these lesions.  Liver MRI does not demonstrate any evidence of metastatic disease to the liver.  CEA is 1.2.  Patient is agreeable to proceed with surgery.  All risks and benefits of performing this procedure were discussed with the patient including pain, infection, bleeding, damage to the surrounding structures, need for more procedures or surgery, and anastomotic leak. The patient voiced understanding of the procedure, all questions were sought and answered, and consent was obtained.  Findings: -Sigmoid colon mass with tattoo just distal; proximal margin measured 8 cm from the mass, distal margin measured 6 cm from the mass -Side-to-side anastomosis performed -Hemostasis noted at the completion of the case   Procedure: The patient was taken to the operating room and placed supine. General endotracheal anesthesia was induced. Intravenous antibiotics were administered per  protocol.  An orogastric tube positioned to decompress the stomach. The abdomen was prepared and draped in the usual sterile fashion.  A time-out was completed verifying correct patient, procedure, site, positioning, and implant(s) and/or special equipment prior to beginning this procedure.  A lower midline incision was made from just below the umbilicus to the pubic symphysis.  This was deepened through the subcutaneous tissues, and hemostasis was achieved with electrocautery.  The linea alba was identified, incised, and the peritoneal cavity was sharply entered.  There were no adhesions to the anterior abdominal wall.  An Alexis wound protector was placed.  Upon inspection of the abdomen, there was no evidence of metastatic disease.  The small bowel was retracted to the right using a moist towel.  The sigmoid mass was identified, and tattoo was noted to be distal to the mass.  The proximal sigmoid and descending colon was freed from its peritoneal attachments along the white line of Toldt starting along the pelvic inlet.  The left ureter was identified, and protected.  The mesentery of the sigmoid colon was scored on both sides down towards the rectum to allow for mobilization.  A point was chosen on the proximal sigmoid colon, 8 cm proximal to the tumor, and the colon was transected using a linear cutting stapler.  A location distal to the tumor was identified, which measured 6 cm from the tumor, and this area of colon was also transected using a linear cutting stapler.  A wedge of mesentery was taken below the section of colon to allow for appropriate sampling of mesenteric lymph nodes.  The specimen was marked with a suture proximally, and was sent to pathology for evaluation.  The proximal and distal segments of  colon were brought into apposition and found to lie comfortably next to each other without tension.  Enterotomies were created along the tinea on both the proximal and distal segments, and a  linear cutting stapler was inserted and fired.  Hemostasis was checked on the staple line.  The enterotomy was closed with a TA stapler.  This specimen of an additional 1 cm proximal and distal was also send to pathology for evaluation.  Two 3-0 silk crotch stitches were placed.  The staple line was then imbricated with 3-0 silk in a Lembert fashion.  The abdomen was then irrigated, and hemostasis was noted.  At this time, all participants in the case changed their gowns and gloves.  All instrument, sponge, and needle counts were noted to be correct.  The fascia was then closed with a running 0 PDS from both the superior and inferior aspects of the incision.  The subcutaneous tissues were irrigated.  Marcaine was instilled in the incision site.  Hemostasis was achieved.  The skin was closed with staples.  Honeycomb dressing was applied.  Dr. Kallie was assisting throughout the procedure and was present for the critical portions of the case, including sigmoid resection and anastomosis.  Final inspection revealed acceptable hemostasis. All counts were correct at the end of the case. The patient was awakened from anesthesia and extubated without complication.  The patient went to the PACU in stable condition.   Dorothyann Brittle, DO  [facility] [address] [place], [place] [address] [phone] (office)

## 2023-10-20 NOTE — Progress Notes (Signed)
 [facility]  [facility] (her direct number is [phone]).  I explained that he tolerated the procedure without difficulty.  He will remain in the hospital until he has return of bowel function and is tolerating a diet.  He has skin staples in place that will be removed in 2 weeks at a follow up visit with me.  He will be evaluated by oncology inpatient and then will need to follow up with them in 4 weeks to discuss other treatments.  We discussed that [facility] will [facility] with [facility] for scheduled Tylenol , Celebrex, Robaxin, and PRN oxycodone .  All questions were answered to her expressed satisfaction.  Plan: -Return to bed on the floor -CLD, ADAT to full liquids -NS at 125 cc/hr -Foley catheter in place, plan for removal tomorrow -Scheduled Tylenol , Celebrex, Gabapentin, Robaxin; PRN oxycodone  and dilaudid -Entereg -Clindamycin  for 1 dose postoperatively -Monitor for return of bowel function -AM labs -Appreciate hospitalist and oncology recommendations  Dorothyann Brittle, DO [facility] [address] [place], [place] [address] [phone] (office)

## 2023-10-20 NOTE — Anesthesia Preprocedure Evaluation (Addendum)
 Anesthesia Evaluation  Patient identified by MRN, date of birth, ID band Patient awake    Reviewed: Allergy & Precautions, H&P , NPO status , Patient's Chart, lab work & pertinent test results  Airway Mallampati: II  TM Distance: >3 FB Neck ROM: Full    Dental no notable dental hx.    Pulmonary neg pulmonary ROS   Pulmonary exam normal breath sounds clear to auscultation       Cardiovascular negative cardio ROS Normal cardiovascular exam Rhythm:Regular Rate:Normal     Neuro/Psych negative neurological ROS  negative psych ROS   GI/Hepatic negative GI ROS, Neg liver ROS,,,  Endo/Other  negative endocrine ROS    Renal/GU negative Renal ROS  negative genitourinary   Musculoskeletal negative musculoskeletal ROS (+)    Abdominal   Peds negative pediatric ROS (+)  Hematology negative hematology ROS (+)   Anesthesia Other Findings   Reproductive/Obstetrics negative OB ROS                             Anesthesia Physical Anesthesia Plan  ASA: 2  Anesthesia Plan: General   Post-op Pain Management:    Induction: Intravenous  PONV Risk Score and Plan:   Airway Management Planned:   Additional Equipment:   Intra-op Plan:   Post-operative Plan: Extubation in OR  Informed Consent: I have reviewed the patients History and Physical, chart, labs and discussed the procedure including the risks, benefits and alternatives for the proposed anesthesia with the patient or authorized representative who has indicated his/her understanding and acceptance.     Dental advisory given  Plan Discussed with: CRNA  Anesthesia Plan Comments:        Anesthesia Quick Evaluation

## 2023-10-20 NOTE — Anesthesia Postprocedure Evaluation (Signed)
 Anesthesia Post Note  Patient: William Francis  Procedure(s) Performed: COLECTOMY, PARTIAL (Abdomen)  Patient location during evaluation: PACU Anesthesia Type: General Level of consciousness: awake and alert Pain management: pain level controlled Vital Signs Assessment: post-procedure vital signs reviewed and stable Respiratory status: spontaneous breathing, nonlabored ventilation, respiratory function stable and patient connected to nasal cannula oxygen Cardiovascular status: blood pressure returned to baseline and stable Postop Assessment: no apparent nausea or vomiting Anesthetic complications: no  No notable events documented.   Last Vitals:  Vitals:   10/20/23 1530 10/20/23 1540  BP: 128/76 116/76  Pulse: 94 81  Resp: 16 16  Temp: 36.6 C 36.5 C  SpO2: 93% 90%    Last Pain:  Vitals:   10/20/23 1551  TempSrc:   PainSc: 8                  Andrea Limes

## 2023-10-20 NOTE — Progress Notes (Signed)
 [facility] Progress Note  3 Days Post-Op  Subjective: Patient seen and examined.  He is resting comfortably in bed.  He has no complaints at this time.  He denies any further blood bowel movements.  He denies abdominal pain.  He has no questions related to surgery.  Objective: Vital signs in last 24 hours: Temp:  [97.9 F (36.6 C)-98.1 F (36.7 C)] 97.9 F (36.6 C) (06/30 0434) Pulse Rate:  [71-79] 71 (06/30 0434) Resp:  [18] 18 (06/30 0434) BP: (109-129)/(77-99) 111/77 (06/30 0434) SpO2:  [97 %-100 %] 97 % (06/30 0434) Weight:  [99 kg] 99 kg (06/29 1100) Last BM Date : 10/19/23  Intake/Output from previous day: 06/29 0701 - 06/30 0700 In: 240 [P.O.:240] Out: -  Intake/Output this shift: No intake/output data recorded.  General appearance: alert, cooperative, and no distress GI: abdomen soft, nondistended, no percussion tenderness, nontender to palpation; no rigidity, guarding, or rebound tenderness  Lab Results:  Recent Labs    10/18/23 0502 10/19/23 0320  WBC 7.1 5.9  HGB 10.8* 10.5*  HCT 31.0* 31.7*  PLT 258 265   BMET Recent Labs    10/19/23 0320  NA 136  K 3.6  CL 102  CO2 26  GLUCOSE 110*  BUN 7  CREATININE 0.94  CALCIUM 8.6*   PT/INR No results for input(s): LABPROT, INR in the last 72 hours.  Studies/Results: MR ABDOMEN W WO CONTRAST Result Date: 10/18/2023 CLINICAL DATA:  Liver lesions suspected by prior CT, new diagnosis colon cancer EXAM: MRI ABDOMEN WITHOUT AND WITH CONTRAST TECHNIQUE: Multiplanar multisequence MR imaging of the abdomen was performed both before and after the administration of intravenous contrast. CONTRAST:  9mL GADAVIST GADOBUTROL 1 MMOL/ML IV SOLN COMPARISON:  CT abdomen pelvis, 10/16/2023 FINDINGS: Lower chest: No acute abnormality. Hepatobiliary: No solid liver abnormality is seen. Multiple scattered fluid signal liver cysts. No solid lesion or suspicious contrast enhancement. No gallstones, gallbladder  wall thickening, or biliary dilatation. Pancreas: Unremarkable. No pancreatic ductal dilatation or surrounding inflammatory changes. Spleen: Normal in size without significant abnormality. Adrenals/Urinary Tract: Adrenal glands are unremarkable. Kidneys are normal, without renal calculi, solid lesion, or hydronephrosis. Stomach/Bowel: Stomach is within normal limits. Sigmoid colon mass is partially included on selected coronal sequences (series 3, image 25). Vascular/Lymphatic: No significant vascular findings are present. No enlarged abdominal lymph nodes. Other: No abdominal wall hernia or abnormality. No ascites. Musculoskeletal: No acute or significant osseous findings. IMPRESSION: 1. Multiple scattered fluid signal liver cysts corresponding to findings of prior CT. No solid lesion or suspicious contrast enhancement. 2. No evidence of lymphadenopathy or metastatic disease in the abdomen. 3. Known sigmoid colon mass is partially included on selected coronal sequences. Electronically Signed   By: Marolyn JONETTA Jaksch M.D.   On: 10/18/2023 20:26    Anti-infectives: Anti-infectives (From admission, onward)    Start     Dose/Rate Route Frequency Ordered Stop   10/19/23 2200  metroNIDAZOLE (FLAGYL) tablet 1,000 mg       Placed in Followed by Linked Group   1,000 mg Oral  Once 10/18/23 1216 10/19/23 2028   10/19/23 2200  neomycin (MYCIFRADIN) tablet 1,000 mg       Placed in Followed by Linked Group   1,000 mg Oral  Once 10/18/23 1216 10/19/23 2028   10/19/23 1500  metroNIDAZOLE (FLAGYL) tablet 1,000 mg       Placed in Followed by Linked Group   1,000 mg Oral  Once 10/18/23 1216 10/19/23 1705   10/19/23 1500  neomycin (MYCIFRADIN) tablet 1,000 mg       Placed in Followed by Linked Group   1,000 mg Oral  Once 10/18/23 1216 10/19/23 1705   10/19/23 1400  metroNIDAZOLE (FLAGYL) tablet 1,000 mg       Placed in Followed by Linked Group   1,000 mg Oral  Once 10/18/23 1216 10/19/23 1541   10/19/23 1400   neomycin (MYCIFRADIN) tablet 1,000 mg       Placed in Followed by Linked Group   1,000 mg Oral  Once 10/18/23 1216 10/19/23 1540       Assessment/Plan:  Patient is a 53 year old male who was admitted with hematochezia and found to have a sigmoid colon mass on colonoscopy.  -Plan for sigmoidectomy today -NPO -Patient completed antibiotics prep yesterday -Prophylactic antibiotics ordered -IVF per primary team -CEA normal at 1.2 -Recommend oncology evaluate the patient prior to discharge -Appreciate hospitalist recommendations -Further recommendations to follow surgery   LOS: 3 days    Draylen Lobue A Maudene Stotler 10/20/2023  Note: Portions of this report [place] have been transcribed using voice recognition software. Every effort has been made to ensure accuracy; however, inadvertent computerized transcription errors [place] still be present.

## 2023-10-20 NOTE — Transfer of Care (Addendum)
 Immediate Anesthesia Transfer of Care Note  Patient: William Francis  Procedure(s) Performed: COLECTOMY, PARTIAL (Abdomen)  Patient Location: PACU  Anesthesia Type:General  Level of Consciousness: drowsy and patient cooperative  Airway & Oxygen Therapy: Patient Spontanous Breathing and Patient connected to nasal cannula oxygen  Post-op Assessment: Report given to RN and Post -op Vital signs reviewed and stable  Post vital signs: Reviewed and stable  Last Vitals:  Vitals Value Taken Time  BP 133/102 10/20/23 14:45  Temp 36.4 10/20/23   14:44  Pulse 87 10/20/23 14:49  Resp 12 10/20/23 14:49  SpO2 98 % 10/20/23 14:49  Vitals shown include unfiled device data.  Last Pain:  Vitals:   10/20/23 1121  TempSrc: Oral  PainSc: 0-No pain      Patients Stated Pain Goal: 6 (10/20/23 1121)  Complications: No notable events documented.

## 2023-10-20 NOTE — Progress Notes (Signed)
 PROGRESS NOTE  William Francis FMW:[id number] DOB: 1970/09/28 DOA: 10/16/2023 PCP: Patient, No Pcp Per  Brief History:  53 year old with no documented chronic medical issues presented from [facility] with hematochezia.  The patient had 3 episodes prior to arrival in the morning of 10/16/2023.  He had some dizziness and nausea with abdominal cramping.  He states that he had noticed some small amounts blood in his stool on 10/15/2023. The patient states that he has had some intermittent hematochezia for at least the last 2 years for which she has not sought any medical care.  However, he states that this episode has been a greater volume.  Denies alcohol, illicit drug use, NSAIDs.SABRA He denies any fevers, chills, chest pain, shortness of breath, coughing hemoptysis.  He denies any dysuria or hematuria.  He denies any melena.  Patient was afebrile with stable with oxygen saturation 97% room air.  WBC 6.4, hemoglobin 14.2, platelets 306.  Sodium 140, potassium 3.9, bicarbonate 26, serum creatinine 0.96.  CTA abd/pelvis showed active GI bleed in the sigmoid colon.  There was mild colonic wall thickening adjacent to the area of the bleeding.  With concern for diverticular bleed versus neoplasm.  There was some mild narrowing of the proximal celiac trunk. EDP spoke with Ahmed  who agrees to consult.   Assessment/Plan: Hematochezia/Lower GI bleed/Acute blood loss anemia - GI consult appreciated - 10/17/23 colonoscopy--sigmoid colon mass with non-bleeding internal hemorrhoids - Typed and screenef - Monitor serial H&H - Clear liquid diet preop - Hgb down to 10.5 although some is due to dilution -Hgb now stable, no further bleeding   Sigmoid Colon Mass--invasive adenocarcinoma -10/17/23 colonoscopy--sigmoid colon mass with non-bleeding internal hemorrhoids -CT chest--No highly suspicious findings to suggest metastatic disease to the chest; nonspecific liver lesions <1 cm;   largest lesion is in the right lobe of liver measuring 1.4 x 0.8 cm -general surgery consult appreciated - pathology--invasive moderately differentiated adenocarcinoma  -10/20/23 sigmoidectomy - CEA pending - diet advancement per Dr. Evonnie - consulted med/onc--Dr. Rogers   Elevated blood pressure - No history of hypertension - Monitor for now - overall normotensive   Liver Lesions -MR liver-- Multiple scattered fluid signal liver cysts corresponding to findings of prior CT. No solid lesion or suspicious contrast enhancement;  no concerning LN in abd               Family Communication:  no  Family at bedside   Consultants:  GI, general surgery   Code Status:  FULL    DVT Prophylaxis:  SCDs     Procedures: As Listed in Progress Note Above   Antibiotics: None       Subjective: Patient denies fevers, chills, headache, chest pain, dyspnea, nausea, vomiting, diarrhea, abdominal pain, dysuria, hematuria, hematochezia, and melena.   Objective: Vitals:   10/19/23 1100 10/19/23 1227 10/19/23 1929 10/20/23 0434  BP:  (!) 129/99 109/80 111/77  Pulse:  72 79 71  Resp:  18 18 18   Temp:  97.9 F (36.6 C) 98.1 F (36.7 C) 97.9 F (36.6 C)  TempSrc:  Oral Oral Oral  SpO2:  99% 100% 97%  Weight: 99 kg     Height:        Intake/Output Summary (Last 24 hours) at 10/20/2023 0805 Last data filed at 10/19/2023 2030 Gross per 24 hour  Intake 240 ml  Output --  Net 240 ml   Weight change:  Exam:  General:  Pt is alert, follows commands appropriately, not in acute distress HEENT: No icterus, No thrush, No neck mass, [place]/AT Cardiovascular: RRR, S1/S2, no rubs, no gallops Respiratory: CTA bilaterally, no wheezing, no crackles, no rhonchi Abdomen: Soft/+BS, non tender, non distended, no guarding Extremities: No edema, No lymphangitis, No petechiae, No rashes, no synovitis   Data Reviewed: I have personally reviewed following labs and imaging studies Basic  Metabolic Panel: Recent Labs  Lab 10/16/23 1034 10/17/23 0457 10/19/23 0320  NA 140 139 136  K 3.9 3.6 3.6  CL 104 104 102  CO2 26 29 26   GLUCOSE 109* 112* 110*  BUN 14 12 7   CREATININE 0.96 0.94 0.94  CALCIUM 9.0 8.6* 8.6*  MG  --   --  2.1  PHOS  --   --  3.8   Liver Function Tests: Recent Labs  Lab 10/16/23 1034  AST 19  ALT 21  ALKPHOS 62  BILITOT 0.5  PROT 7.3  ALBUMIN 4.1   Recent Labs  Lab 10/16/23 1034  LIPASE 25   No results for input(s): AMMONIA in the last 168 hours. Coagulation Profile: Recent Labs  Lab 10/17/23 0457  INR 1.1   CBC: Recent Labs  Lab 10/16/23 1034 10/16/23 1650 10/17/23 0457 10/17/23 0942 10/17/23 1618 10/18/23 0502 10/19/23 0320  WBC 6.4  --   --   --   --  7.1 5.9  NEUTROABS 2.8  --   --   --   --   --   --   HGB 14.2   < > 11.7* 11.8* 10.9* 10.8* 10.5*  HCT 40.8   < > 33.8* 34.0* 31.5* 31.0* 31.7*  MCV 88.7  --   --   --   --  89.3 89.5  PLT 306  --   --   --   --  258 265   < > = values in this interval not displayed.   Cardiac Enzymes: No results for input(s): CKTOTAL, CKMB, CKMBINDEX, TROPONINI in the last 168 hours. BNP: Invalid input(s): POCBNP CBG: No results for input(s): GLUCAP in the last 168 hours. HbA1C: No results for input(s): HGBA1C in the last 72 hours. Urine analysis:    Component Value Date/Time   COLORURINE YELLOW 10/16/2023 1016   APPEARANCEUR CLEAR 10/16/2023 1016   LABSPEC 1.023 10/16/2023 1016   PHURINE 5.0 10/16/2023 1016   GLUCOSEU NEGATIVE 10/16/2023 1016   HGBUR SMALL (A) 10/16/2023 1016   BILIRUBINUR NEGATIVE 10/16/2023 1016   KETONESUR NEGATIVE 10/16/2023 1016   PROTEINUR NEGATIVE 10/16/2023 1016   UROBILINOGEN 0.2 04/20/2013 1424   NITRITE NEGATIVE 10/16/2023 1016   LEUKOCYTESUR NEGATIVE 10/16/2023 1016   Sepsis Labs: @LABRCNTIP (procalcitonin:4,lacticidven:4) ) Recent Results (from the past 240 hours)  Surgical PCR screen     Status: None   Collection  Time: 10/19/23  8:35 PM   Specimen: Nasal Mucosa; Nasal Swab  Result Value Ref Range Status   MRSA, PCR NEGATIVE NEGATIVE Final   Staphylococcus aureus NEGATIVE NEGATIVE Final    Comment: (NOTE) The Xpert SA Assay (FDA approved for NASAL specimens in patients 71 years of age and older), is one component of a comprehensive surveillance program. It is not intended to diagnose infection nor to guide or monitor treatment. Performed at [facility], [address] [place], [place] [address]      Scheduled Meds: Continuous Infusions:  Procedures/Studies: MR ABDOMEN W WO CONTRAST Result Date: 10/18/2023 CLINICAL DATA:  Liver lesions suspected by prior CT, new diagnosis colon  cancer EXAM: MRI ABDOMEN WITHOUT AND WITH CONTRAST TECHNIQUE: Multiplanar multisequence MR imaging of the abdomen was performed both before and after the administration of intravenous contrast. CONTRAST:  9mL GADAVIST GADOBUTROL 1 MMOL/ML IV SOLN COMPARISON:  CT abdomen pelvis, 10/16/2023 FINDINGS: Lower chest: No acute abnormality. Hepatobiliary: No solid liver abnormality is seen. Multiple scattered fluid signal liver cysts. No solid lesion or suspicious contrast enhancement. No gallstones, gallbladder wall thickening, or biliary dilatation. Pancreas: Unremarkable. No pancreatic ductal dilatation or surrounding inflammatory changes. Spleen: Normal in size without significant abnormality. Adrenals/Urinary Tract: Adrenal glands are unremarkable. Kidneys are normal, without renal calculi, solid lesion, or hydronephrosis. Stomach/Bowel: Stomach is within normal limits. Sigmoid colon mass is partially included on selected coronal sequences (series 3, image 25). Vascular/Lymphatic: No significant vascular findings are present. No enlarged abdominal lymph nodes. Other: No abdominal wall hernia or abnormality. No ascites. Musculoskeletal: No acute or significant osseous findings. IMPRESSION: 1. Multiple scattered fluid signal liver cysts  corresponding to findings of prior CT. No solid lesion or suspicious contrast enhancement. 2. No evidence of lymphadenopathy or metastatic disease in the abdomen. 3. Known sigmoid colon mass is partially included on selected coronal sequences. Electronically Signed   By: Marolyn JONETTA Jaksch M.D.   On: 10/18/2023 20:26   CT CHEST W CONTRAST Result Date: 10/18/2023 CLINICAL DATA:  Colon cancer.  Staging. * Tracking Code: BO * EXAM: CT CHEST WITH CONTRAST TECHNIQUE: Multidetector CT imaging of the chest was performed during intravenous contrast administration. RADIATION DOSE REDUCTION: This exam was performed according to the departmental dose-optimization program which includes automated exposure control, adjustment of the mA and/or kV according to patient size and/or use of iterative reconstruction technique. CONTRAST:  75mL OMNIPAQUE  IOHEXOL  300 MG/ML  SOLN COMPARISON:  None Available. FINDINGS: Cardiovascular: Heart size is normal. No pericardial effusion. No significant aortic or coronary artery atherosclerotic calcifications. Mediastinum/Nodes: No enlarged mediastinal, hilar, or axillary lymph nodes. Thyroid gland, trachea, and esophagus demonstrate no significant findings. Lungs/Pleura: No pleural effusion, airspace consolidation, atelectasis, or pneumothorax. The central airways are grossly patent. Tiny nodule within the right upper lobe measures 3 mm, image 49/3. In the posterior right lower lobe there is a 2 mm nodule, image 23/3. Upper Abdomen: No acute abnormality. Multiple scattered low-attenuation lesions within both lobes of liver. The largest lesion is in the right lobe of liver measuring 1.4 x 0.8 cm and fluid attenuation, image 163/2. The remaining are less than 1 cm in technically too small to characterize. Musculoskeletal: No chest wall abnormality. No acute or significant osseous findings. IMPRESSION: 1. No acute cardiopulmonary abnormalities. No highly suspicious findings to suggest metastatic  disease to the chest. 2. There are 2 tiny nodules within the right lung measuring up to 3 mm. These are nonspecific. Attention on future surveillance imaging is advised in this patient with newly diagnosed colon cancer. 3. Multiple scattered low-attenuation lesions within both lobes of liver. Most of these are less than 1 cm and are technically too small to reliably characterize. The largest lesion is in the right lobe of liver measuring 1.4 x 0.8 cm. These are favored to represent benign cysts or hemangiomas. In a patient with newly diagnosed colon cancer consider more definitive characterization of the subcentimeter lesions with nonemergent dedicated abdominal MRI with and without contrast. Electronically Signed   By: Waddell Calk M.D.   On: 10/18/2023 04:41   CT Angio Abd/Pel W and/or Wo Contrast Result Date: 10/16/2023 CLINICAL DATA:  Lower GI bleed.  Hematochezia. EXAM: CTA ABDOMEN AND  PELVIS WITHOUT AND WITH CONTRAST TECHNIQUE: Multidetector CT imaging of the abdomen and pelvis was performed using the standard protocol during bolus administration of intravenous contrast. Multiplanar reconstructed images and MIPs were obtained and reviewed to evaluate the vascular anatomy. RADIATION DOSE REDUCTION: This exam was performed according to the departmental dose-optimization program which includes automated exposure control, adjustment of the mA and/or kV according to patient size and/or use of iterative reconstruction technique. CONTRAST:  OMNIPAQUE  IOHEXOL  350 MG/ML SOLN COMPARISON:  CT abdomen pelvis 04/20/2013 FINDINGS: VASCULAR Aorta: Normal caliber aorta without aneurysm, dissection, vasculitis or significant stenosis. Celiac: Mild narrowing of the proximal celiac trunk and the configuration is suggestive for median arcuate ligament compression. Main branches of the celiac trunk are patent. No evidence for aneurysm or dissection. SMA: Patent without evidence of aneurysm, dissection, vasculitis or  significant stenosis. Renals: Both renal arteries are patent without evidence of aneurysm, dissection, vasculitis, fibromuscular dysplasia or significant stenosis. IMA: Patent without evidence of aneurysm, dissection, vasculitis or significant stenosis. Inflow: Patent without evidence of aneurysm, dissection, vasculitis or significant stenosis. Proximal Outflow: Proximal femoral arteries are patent bilaterally. Veins: IVC and iliac veins are patent. Renal veins are patent. Main portal venous system is patent. Prominent venous structures in the pelvis. Review of the MIP images confirms the above findings. NON-VASCULAR Lower chest: Mild motion artifact at the lung bases. No significant consolidation at the lung bases. No pleural effusions. Hepatobiliary: Small hypodensities in liver likely represent hepatic cysts but some are too small to definitively characterize. Normal appearance of the gallbladder. No biliary dilatation. Pancreas: Unremarkable. No pancreatic ductal dilatation or surrounding inflammatory changes. Spleen: Normal in size without focal abnormality. Adrenals/Urinary Tract: Normal adrenal glands. Negative for kidney stones. No hydronephrosis. Normal urinary bladder. No suspicious renal lesions. Stomach/Bowel: Normal stomach. Evidence for contrast extravasation on both the arterial and venous phase imaging in the sigmoid colon located in the right lower abdomen. This finding is best seen on image 163/10. There is mild colonic wall thickening adjacent to the area of bleeding, best seen on the venous phase imaging, image 66, sequence 16. Normal appearance of the small bowel. Question previous appendectomy. No significant colonic diverticula. Lymphatic: No significant lymph node enlargement in the abdomen and pelvis but there is a mildly prominent lymph node or lymph nodes near the sigmoid colon wall thickening and bleeding on image 72/19 and image 89/20. Reproductive: Focal low-density area in the central  aspect of the prostate on image 93/16 and question previous intervention in this area such as the TURP. Prostate size appears to be within normal limits. Other: Right inguinal hernia containing fat. Negative for free fluid. Small umbilical hernia containing fat. Musculoskeletal: No acute bone abnormality. No suspicious osseous lesions. IMPRESSION: 1. Positive for active GI bleeding in the sigmoid colon. 2. Mild colonic wall thickening adjacent to the area of bleeding. GI bleeding etiology is uncertain. Although this could be associated with a diverticular bleed, cannot exclude an underlying colonic neoplasm. In addition, there is a mildly prominent lymph node near the sigmoid colon that is nonspecific. 3. Small hypodense structures in liver probably represent hepatic cysts. However, depending on the etiology for the GI bleeding, this [place] need further characterization. 4. Mild narrowing of the proximal celiac trunk and the configuration is suggestive for median arcuate ligament compression. 5. Right inguinal hernia containing fat. Small umbilical hernia containing fat. 6. Focal low-density area in the prostate and question previous surgical intervention in this area. These results were called by telephone at  the time of interpretation on 10/16/2023 at 12:47 pm to provider CHRISTOPHER RIGNEY , who verbally acknowledged these results. Electronically Signed   By: Juliene Balder M.D.   On: 10/16/2023 12:49    Alm Schneider, DO  [facility]  If 7PM-7AM, please contact night-coverage [email or web address] Password [email or web address] 10/20/2023, 8:05 AM   LOS: 3 days

## 2023-10-20 NOTE — Anesthesia Procedure Notes (Signed)
 Procedure Name: Intubation Date/Time: 10/20/2023 12:29 PM  Performed by: Elaine Delon CROME, CRNAPre-anesthesia Checklist: Patient identified, Emergency Drugs available, Suction available and Patient being monitored Patient Re-evaluated:Patient Re-evaluated prior to induction Oxygen Delivery Method: Circle system utilized Preoxygenation: Pre-oxygenation with 100% oxygen Induction Type: IV induction Ventilation: Mask ventilation without difficulty Laryngoscope Size: Mac and 4 Grade View: Grade I Tube type: Oral Tube size: 7.5 mm Number of attempts: 1 Airway Equipment and Method: Stylet and Oral airway Placement Confirmation: ETT inserted through vocal cords under direct vision, positive ETCO2 and breath sounds checked- equal and bilateral Secured at: 21 cm Tube secured with: Tape Dental Injury: Teeth and Oropharynx as per pre-operative assessment

## 2023-10-21 ENCOUNTER — Encounter (HOSPITAL_COMMUNITY): Payer: Self-pay | Admitting: Surgery

## 2023-10-21 DIAGNOSIS — K922 Gastrointestinal hemorrhage, unspecified: Secondary | ICD-10-CM | POA: Diagnosis not present

## 2023-10-21 DIAGNOSIS — K921 Melena: Secondary | ICD-10-CM | POA: Diagnosis not present

## 2023-10-21 DIAGNOSIS — C189 Malignant neoplasm of colon, unspecified: Secondary | ICD-10-CM

## 2023-10-21 LAB — BASIC METABOLIC PANEL WITH GFR
Anion gap: 11 (ref 5–15)
BUN: 8 mg/dL (ref 6–20)
CO2: 22 mmol/L (ref 22–32)
Calcium: 8.5 mg/dL — ABNORMAL LOW (ref 8.9–10.3)
Chloride: 103 mmol/L (ref 98–111)
Creatinine, Ser: 0.92 mg/dL (ref 0.61–1.24)
GFR, Estimated: >60 mL/min (ref >60)
Glucose, Bld: 117 mg/dL — ABNORMAL HIGH (ref 70–99)
Potassium: 3.8 mmol/L (ref 3.5–5.1)
Sodium: 136 mmol/L (ref 135–145)

## 2023-10-21 LAB — CBC
HCT: 29.1 % — ABNORMAL LOW (ref 39.0–52.0)
Hemoglobin: 10.3 g/dL — ABNORMAL LOW (ref 13.0–17.0)
MCH: 31.3 pg (ref 26.0–34.0)
MCHC: 35.4 g/dL (ref 30.0–36.0)
MCV: 88.4 fL (ref 80.0–100.0)
Platelets: 307 10*3/uL (ref 150–400)
RBC: 3.29 MIL/uL — ABNORMAL LOW (ref 4.22–5.81)
RDW: 13.3 % (ref 11.5–15.5)
WBC: 10.5 10*3/uL (ref 4.0–10.5)
nRBC: 0 % (ref 0.0–0.2)

## 2023-10-21 NOTE — Progress Notes (Addendum)
 PROGRESS NOTE  William Francis FMW:[id number] DOB: [birth year] DOA: 10/16/2023 PCP: Patient, No Pcp Per  Brief History:  53 year old with no documented chronic medical issues presented from [facility] facility with hematochezia.  The patient had 3 episodes prior to arrival in the morning of 10/16/2023.  He had some dizziness and nausea with abdominal cramping.  He states that he had noticed some small amounts blood in his stool on 10/15/2023. The patient states that he has had some intermittent hematochezia for at least the last 2 years for which she has not sought any medical care.  However, he states that this episode has been a greater volume.  Denies alcohol, illicit drug use, NSAIDs.SABRA He denies any fevers, chills, chest pain, shortness of breath, coughing hemoptysis.  He denies any dysuria or hematuria.  He denies any melena.  Patient was afebrile with stable with oxygen saturation 97% room air.  WBC 6.4, hemoglobin 14.2, platelets 306.  Sodium 140, potassium 3.9, bicarbonate 26, serum creatinine 0.96.  CTA abd/pelvis showed active GI bleed in the sigmoid colon.  There was mild colonic wall thickening adjacent to the area of the bleeding.  With concern for diverticular bleed versus neoplasm.  There was some mild narrowing of the proximal celiac trunk. EDP spoke with Ahmed  who agrees to consult.   Assessment/Plan:  Hematochezia/Lower GI bleed/Acute blood loss anemia - GI consult appreciated - 10/17/23 colonoscopy--sigmoid colon mass with non-bleeding internal hemorrhoids - Typed and screenef - Monitor serial H&H - Clear liquid diet preop - Hgb down to 10.5 although some is due to dilution -Hgb now stable, no further bleeding   Sigmoid Colon Mass - final pathology still pending --adeno vs SCC -10/17/23 colonoscopy--sigmoid colon mass with non-bleeding internal hemorrhoids -CT chest--No highly suspicious findings to suggest metastatic disease to the chest;  nonspecific liver lesions <1 cm;  largest lesion is in the right lobe of liver measuring 1.4 x 0.8 cm -general surgery consult appreciated - pathology--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 Evonnie - consulted med/onc--Dr. Oswaldo will see while patient remains inpatient   Elevated blood pressure - No history of hypertension - Monitor for now - overall normotensive   Liver Lesions -MR liver-- Multiple scattered fluid signal liver cysts corresponding to findings of prior CT. No solid lesion or suspicious contrast enhancement;  no concerning LN in abd               Family Communication:  no  Family at bedside   Consultants:  GI, general surgery, med/onc   Code Status:  FULL    DVT Prophylaxis:  SCDs     Procedures: As Listed in Progress Note Above   Antibiotics: None            Subjective: Patient denies fevers, chills, headache, chest pain, dyspnea, nausea, vomiting, diarrhea, abdominal pain, dysuria, hematuria, hematochezia, and melena.   Objective: Vitals:   10/20/23 1952 10/21/23 0419 10/21/23 0707 10/21/23 1256  BP: 120/87 124/86  123/79  Pulse: 93 93  96  Resp: 20 17    Temp: 97.8 F (36.6 C) 98.3 F (36.8 C)  98.4 F (36.9 C)  TempSrc: Oral Oral  Oral  SpO2: 93% 95%  96%  Weight:   99.1 kg 91.9 kg  Height:        Intake/Output Summary (Last 24 hours)  at 10/21/2023 1845 Last data filed at 10/21/2023 1700 Gross per 24 hour  Intake 831.85 ml  Output --  Net 831.85 ml   Weight change:  Exam:  General:  Pt is alert, follows commands appropriately, not in acute distress HEENT: No icterus, No thrush, No neck mass, [place]/AT Cardiovascular: RRR, S1/S2, no rubs, no gallops Respiratory: CTA bilaterally, no wheezing, no crackles, no rhonchi Abdomen: Soft/+BS, mild diffuse tender, non distended, no  guarding Extremities: No edema, No lymphangitis, No petechiae, No rashes, no synovitis   Data Reviewed: I have personally reviewed following labs and imaging studies Basic Metabolic Panel: Recent Labs  Lab 10/16/23 1034 10/17/23 0457 10/19/23 0320 10/21/23 0433  NA 140 139 136 136  K 3.9 3.6 3.6 3.8  CL 104 104 102 103  CO2 26 29 26 22   GLUCOSE 109* 112* 110* 117*  BUN 14 12 7 8   CREATININE 0.96 0.94 0.94 0.92  CALCIUM 9.0 8.6* 8.6* 8.5*  MG  --   --  2.1  --   PHOS  --   --  3.8  --    Liver Function Tests: Recent Labs  Lab 10/16/23 1034  AST 19  ALT 21  ALKPHOS 62  BILITOT 0.5  PROT 7.3  ALBUMIN 4.1   Recent Labs  Lab 10/16/23 1034  LIPASE 25   No results for input(s): AMMONIA in the last 168 hours. Coagulation Profile: Recent Labs  Lab 10/17/23 0457  INR 1.1   CBC: Recent Labs  Lab 10/16/23 1034 10/16/23 1650 10/17/23 0942 10/17/23 1618 10/18/23 0502 10/19/23 0320 10/21/23 0433  WBC 6.4  --   --   --  7.1 5.9 10.5  NEUTROABS 2.8  --   --   --   --   --   --   HGB 14.2   < > 11.8* 10.9* 10.8* 10.5* 10.3*  HCT 40.8   < > 34.0* 31.5* 31.0* 31.7* 29.1*  MCV 88.7  --   --   --  89.3 89.5 88.4  PLT 306  --   --   --  258 265 307   < > = values in this interval not displayed.   Cardiac Enzymes: No results for input(s): CKTOTAL, CKMB, CKMBINDEX, TROPONINI in the last 168 hours. BNP: Invalid input(s): POCBNP CBG: No results for input(s): GLUCAP in the last 168 hours. HbA1C: No results for input(s): HGBA1C in the last 72 hours. Urine analysis:    Component Value Date/Time   COLORURINE YELLOW 10/16/2023 1016   APPEARANCEUR CLEAR 10/16/2023 1016   LABSPEC 1.023 10/16/2023 1016   PHURINE 5.0 10/16/2023 1016   GLUCOSEU NEGATIVE 10/16/2023 1016   HGBUR SMALL (A) 10/16/2023 1016   BILIRUBINUR NEGATIVE 10/16/2023 1016   KETONESUR NEGATIVE 10/16/2023 1016   PROTEINUR NEGATIVE 10/16/2023 1016   UROBILINOGEN 0.2 04/20/2013 1424    NITRITE NEGATIVE 10/16/2023 1016   LEUKOCYTESUR NEGATIVE 10/16/2023 1016   Sepsis Labs: @LABRCNTIP (procalcitonin:4,lacticidven:4) ) Recent Results (from the past 240 hours)  Surgical PCR screen     Status: None   Collection Time: 10/19/23  8:35 PM   Specimen: Nasal Mucosa; Nasal Swab  Result Value Ref Range Status   MRSA, PCR NEGATIVE NEGATIVE Final   Staphylococcus aureus NEGATIVE NEGATIVE Final    Comment: (NOTE) The Xpert SA Assay (FDA approved for NASAL specimens in patients 73 years of age and older), is one component of a comprehensive surveillance program. It is not intended to diagnose infection nor to guide or monitor treatment.  Performed at [facility], [address], [place], [place] [address]      Scheduled Meds:  acetaminophen   1,000 mg Oral Q6H   alvimopan  12 mg Oral BID   celecoxib  200 mg Oral BID   enoxaparin (LOVENOX) injection  40 mg Subcutaneous Q24H   feeding supplement  237 mL Oral BID BM   gabapentin  300 mg Oral BID   methocarbamol  500 mg Oral TID   Continuous Infusions:  Procedures/Studies: MR ABDOMEN W WO CONTRAST Result Date: 10/18/2023 CLINICAL DATA:  Liver lesions suspected by prior CT, new diagnosis colon cancer EXAM: MRI ABDOMEN WITHOUT AND WITH CONTRAST TECHNIQUE: Multiplanar multisequence MR imaging of the abdomen was performed both before and after the administration of intravenous contrast. CONTRAST:  9mL GADAVIST GADOBUTROL 1 MMOL/ML IV SOLN COMPARISON:  CT abdomen pelvis, 10/16/2023 FINDINGS: Lower chest: No acute abnormality. Hepatobiliary: No solid liver abnormality is seen. Multiple scattered fluid signal liver cysts. No solid lesion or suspicious contrast enhancement. No gallstones, gallbladder wall thickening, or biliary dilatation. Pancreas: Unremarkable. No pancreatic ductal dilatation or surrounding inflammatory changes. Spleen: Normal in size without significant abnormality. Adrenals/Urinary Tract: Adrenal glands are unremarkable.  Kidneys are normal, without renal calculi, solid lesion, or hydronephrosis. Stomach/Bowel: Stomach is within normal limits. Sigmoid colon mass is partially included on selected coronal sequences (series 3, image 25). Vascular/Lymphatic: No significant vascular findings are present. No enlarged abdominal lymph nodes. Other: No abdominal wall hernia or abnormality. No ascites. Musculoskeletal: No acute or significant osseous findings. IMPRESSION: 1. Multiple scattered fluid signal liver cysts corresponding to findings of prior CT. No solid lesion or suspicious contrast enhancement. 2. No evidence of lymphadenopathy or metastatic disease in the abdomen. 3. Known sigmoid colon mass is partially included on selected coronal sequences. Electronically Signed   By: Marolyn JONETTA Jaksch M.D.   On: 10/18/2023 20:26   CT CHEST W CONTRAST Result Date: 10/18/2023 CLINICAL DATA:  Colon cancer.  Staging. * Tracking Code: BO * EXAM: CT CHEST WITH CONTRAST TECHNIQUE: Multidetector CT imaging of the chest was performed during intravenous contrast administration. RADIATION DOSE REDUCTION: This exam was performed according to the departmental dose-optimization program which includes automated exposure control, adjustment of the mA and/or kV according to patient size and/or use of iterative reconstruction technique. CONTRAST:  75mL OMNIPAQUE  IOHEXOL  300 MG/ML  SOLN COMPARISON:  None Available. FINDINGS: Cardiovascular: Heart size is normal. No pericardial effusion. No significant aortic or coronary artery atherosclerotic calcifications. Mediastinum/Nodes: No enlarged mediastinal, hilar, or axillary lymph nodes. Thyroid gland, trachea, and esophagus demonstrate no significant findings. Lungs/Pleura: No pleural effusion, airspace consolidation, atelectasis, or pneumothorax. The central airways are grossly patent. Tiny nodule within the right upper lobe measures 3 mm, image 49/3. In the posterior right lower lobe there is a 2 mm nodule, image  23/3. Upper Abdomen: No acute abnormality. Multiple scattered low-attenuation lesions within both lobes of liver. The largest lesion is in the right lobe of liver measuring 1.4 x 0.8 cm and fluid attenuation, image 163/2. The remaining are less than 1 cm in technically too small to characterize. Musculoskeletal: No chest wall abnormality. No acute or significant osseous findings. IMPRESSION: 1. No acute cardiopulmonary abnormalities. No highly suspicious findings to suggest metastatic disease to the chest. 2. There are 2 tiny nodules within the right lung measuring up to 3 mm. These are nonspecific. Attention on future surveillance imaging is advised in this patient with newly diagnosed colon cancer. 3. Multiple scattered low-attenuation lesions within both lobes of liver.  Most of these are less than 1 cm and are technically too small to reliably characterize. The largest lesion is in the right lobe of liver measuring 1.4 x 0.8 cm. These are favored to represent benign cysts or hemangiomas. In a patient with newly diagnosed colon cancer consider more definitive characterization of the subcentimeter lesions with nonemergent dedicated abdominal MRI with and without contrast. Electronically Signed   By: Waddell Calk M.D.   On: 10/18/2023 04:41   CT Angio Abd/Pel W and/or Wo Contrast Result Date: 10/16/2023 CLINICAL DATA:  Lower GI bleed.  Hematochezia. EXAM: CTA ABDOMEN AND PELVIS WITHOUT AND WITH CONTRAST TECHNIQUE: Multidetector CT imaging of the abdomen and pelvis was performed using the standard protocol during bolus administration of intravenous contrast. Multiplanar reconstructed images and MIPs were obtained and reviewed to evaluate the vascular anatomy. RADIATION DOSE REDUCTION: This exam was performed according to the departmental dose-optimization program which includes automated exposure control, adjustment of the mA and/or kV according to patient size and/or use of iterative reconstruction technique.  CONTRAST:  OMNIPAQUE  IOHEXOL  350 MG/ML SOLN COMPARISON:  CT abdomen pelvis 04/20/2013 FINDINGS: VASCULAR Aorta: Normal caliber aorta without aneurysm, dissection, vasculitis or significant stenosis. Celiac: Mild narrowing of the proximal celiac trunk and the configuration is suggestive for median arcuate ligament compression. Main branches of the celiac trunk are patent. No evidence for aneurysm or dissection. SMA: Patent without evidence of aneurysm, dissection, vasculitis or significant stenosis. Renals: Both renal arteries are patent without evidence of aneurysm, dissection, vasculitis, fibromuscular dysplasia or significant stenosis. IMA: Patent without evidence of aneurysm, dissection, vasculitis or significant stenosis. Inflow: Patent without evidence of aneurysm, dissection, vasculitis or significant stenosis. Proximal Outflow: Proximal femoral arteries are patent bilaterally. Veins: IVC and iliac veins are patent. Renal veins are patent. Main portal venous system is patent. Prominent venous structures in the pelvis. Review of the MIP images confirms the above findings. NON-VASCULAR Lower chest: Mild motion artifact at the lung bases. No significant consolidation at the lung bases. No pleural effusions. Hepatobiliary: Small hypodensities in liver likely represent hepatic cysts but some are too small to definitively characterize. Normal appearance of the gallbladder. No biliary dilatation. Pancreas: Unremarkable. No pancreatic ductal dilatation or surrounding inflammatory changes. Spleen: Normal in size without focal abnormality. Adrenals/Urinary Tract: Normal adrenal glands. Negative for kidney stones. No hydronephrosis. Normal urinary bladder. No suspicious renal lesions. Stomach/Bowel: Normal stomach. Evidence for contrast extravasation on both the arterial and venous phase imaging in the sigmoid colon located in the right lower abdomen. This finding is best seen on image 163/10. There is mild colonic  wall thickening adjacent to the area of bleeding, best seen on the venous phase imaging, image 66, sequence 16. Normal appearance of the small bowel. Question previous appendectomy. No significant colonic diverticula. Lymphatic: No significant lymph node enlargement in the abdomen and pelvis but there is a mildly prominent lymph node or lymph nodes near the sigmoid colon wall thickening and bleeding on image 72/19 and image 89/20. Reproductive: Focal low-density area in the central aspect of the prostate on image 93/16 and question previous intervention in this area such as the TURP. Prostate size appears to be within normal limits. Other: Right inguinal hernia containing fat. Negative for free fluid. Small umbilical hernia containing fat. Musculoskeletal: No acute bone abnormality. No suspicious osseous lesions. IMPRESSION: 1. Positive for active GI bleeding in the sigmoid colon. 2. Mild colonic wall thickening adjacent to the area of bleeding. GI bleeding etiology is uncertain. Although this could  be associated with a diverticular bleed, cannot exclude an underlying colonic neoplasm. In addition, there is a mildly prominent lymph node near the sigmoid colon that is nonspecific. 3. Small hypodense structures in liver probably represent hepatic cysts. However, depending on the etiology for the GI bleeding, this Lohn need further characterization. 4. Mild narrowing of the proximal celiac trunk and the configuration is suggestive for median arcuate ligament compression. 5. Right inguinal hernia containing fat. Small umbilical hernia containing fat. 6. Focal low-density area in the prostate and question previous surgical intervention in this area. These results were called by telephone at the time of interpretation on 10/16/2023 at 12:47 pm to provider CHRISTOPHER RIGNEY , who verbally acknowledged these results. Electronically Signed   By: Juliene Balder M.D.   On: 10/16/2023 12:49    Alm Schneider, DO  [facility]  If 7PM-7AM, please contact night-coverage [email or web address] Password [email or web address] 10/21/2023, 6:45 PM   LOS: 4 days

## 2023-10-21 NOTE — Plan of Care (Signed)
  Problem: Activity: Goal: Risk for activity intolerance will decrease Outcome: Adequate for Discharge   

## 2023-10-21 NOTE — Progress Notes (Signed)
 Patient tolerated Full liquid diet for lunch,Dr Pappayliou notified,see new orders. Plan of care on going.

## 2023-10-21 NOTE — Progress Notes (Signed)
 Mobility Specialist Progress Note:    10/21/23 1330  Mobility  Activity Refused mobility   Pt politely refused mobility, has been ambulating independently in the room. All needs met.   Sherrilee Ditty Mobility Specialist Please contact via Special educational needs teacher or  Rehab office at [phone]

## 2023-10-21 NOTE — Plan of Care (Signed)
  Problem: Education: Goal: Knowledge of General Education information will improve Description: Including pain rating scale, medication(s)/side effects and non-pharmacologic comfort measures 10/21/2023 1548 by Eliany Mccarter K, LPN Outcome: Progressing 10/21/2023 1548 by Breyon Sigg K, LPN Outcome: Progressing   Problem: Education: Goal: Knowledge of General Education information will improve Description: Including pain rating scale, medication(s)/side effects and non-pharmacologic comfort measures 10/21/2023 1548 by Cecil Vandyke K, LPN Outcome: Progressing 10/21/2023 1548 by Rohith Fauth K, LPN Outcome: Progressing

## 2023-10-21 NOTE — Plan of Care (Signed)

## 2023-10-21 NOTE — Progress Notes (Signed)
 [facility] Progress Note  1 Day Post-Op  Subjective: Patient seen and examined.  He is resting comfortably in bed.  He tolerated his clear liquids without nausea and vomiting.  He has some occasional pains with moving, but otherwise has no acute complaints.  He confirms passing some flatus, but denies any bowel movements.  Objective: Vital signs in last 24 hours: Temp:  [97.5 F (36.4 C)-98.3 F (36.8 C)] 98.3 F (36.8 C) (07/01 0419) Pulse Rate:  [75-94] 93 (07/01 0419) Resp:  [12-20] 17 (07/01 0419) BP: (116-135)/(72-102) 124/86 (07/01 0419) SpO2:  [90 %-100 %] 95 % (07/01 0419) Last BM Date : 10/19/23  Intake/Output from previous day: 06/30 0701 - 07/01 0700 In: 2961.9 [I.V.:2861.9; IV Piggyback:100] Out: 110 [Urine:50; Blood:40] Intake/Output this shift: No intake/output data recorded.  General appearance: alert, cooperative, and no distress GI: Abdomen soft, nondistended, no percussion tenderness, nontender to palpation; no rigidity, guarding, rebound tenderness; midline incision C/D/I with honeycomb dressing in place  Lab Results:  Recent Labs    10/19/23 0320 10/21/23 0433  WBC 5.9 10.5  HGB 10.5* 10.3*  HCT 31.7* 29.1*  PLT 265 307   BMET Recent Labs    10/19/23 0320 10/21/23 0433  NA 136 136  K 3.6 3.8  CL 102 103  CO2 26 22  GLUCOSE 110* 117*  BUN 7 8  CREATININE 0.94 0.92  CALCIUM 8.6* 8.5*   PT/INR No results for input(s): LABPROT, INR in the last 72 hours.  Studies/Results: No results found.  Anti-infectives: Anti-infectives (From admission, onward)    Start     Dose/Rate Route Frequency Ordered Stop   10/20/23 1630  clindamycin  (CLEOCIN ) IVPB 900 mg        900 mg 100 mL/hr over 30 Minutes Intravenous Every 8 hours 10/20/23 1542 10/20/23 1758   10/20/23 1200  ertapenem (INVANZ) 1 g in sodium chloride  0.9 % 100 mL IVPB        1 g 200 mL/hr over 30 Minutes Intravenous On call to O.R. 10/20/23 0918 10/20/23 1306    10/19/23 2200  metroNIDAZOLE (FLAGYL) tablet 1,000 mg       Placed in Followed by Linked Group   1,000 mg Oral  Once 10/18/23 1216 10/19/23 2028   10/19/23 2200  neomycin (MYCIFRADIN) tablet 1,000 mg       Placed in Followed by Linked Group   1,000 mg Oral  Once 10/18/23 1216 10/19/23 2028   10/19/23 1500  metroNIDAZOLE (FLAGYL) tablet 1,000 mg       Placed in Followed by Linked Group   1,000 mg Oral  Once 10/18/23 1216 10/19/23 1705   10/19/23 1500  neomycin (MYCIFRADIN) tablet 1,000 mg       Placed in Followed by Linked Group   1,000 mg Oral  Once 10/18/23 1216 10/19/23 1705   10/19/23 1400  metroNIDAZOLE (FLAGYL) tablet 1,000 mg       Placed in Followed by Linked Group   1,000 mg Oral  Once 10/18/23 1216 10/19/23 1541   10/19/23 1400  neomycin (MYCIFRADIN) tablet 1,000 mg       Placed in Followed by Linked Group   1,000 mg Oral  Once 10/18/23 1216 10/19/23 1540       Assessment/Plan:  [facility]AS Protocol Daily Progress Note  POD#1 s/p sigmoidectomy Diet: Tolerated clears, advance to full liquids IVF: Hep-Lock today Foley: Removed last night BM/Flatus: Confirms passing flatus, no bowel movements yet Entereg: Will continue Ambulation within 24 hours of surgery: Yes  Post-op antibiotics: 24 hour course of clindamycin  completed Multimodal pain management: Scheduled Robaxin, Tylenol , Celebrex, and gabapentin; as needed oxycodone  and Dilaudid -Continue SCDs and Lovenox -No leukocytosis, and hemoglobin stable, will continue to monitor -Monitor for return of bowel function -Oncology has been requested to see the patient given his incarceration status -Await final pathology -Appreciate hospitalist recommendations    LOS: 4 days    Jacky Hartung A Delayni Streed 10/21/2023  Note: Portions of this report Latulippe have been transcribed using voice recognition software. Every effort has been made to ensure accuracy; however, inadvertent computerized transcription errors Pollman still be  present.

## 2023-10-22 ENCOUNTER — Encounter (HOSPITAL_COMMUNITY): Payer: Self-pay | Admitting: Gastroenterology

## 2023-10-22 ENCOUNTER — Ambulatory Visit (INDEPENDENT_AMBULATORY_CARE_PROVIDER_SITE_OTHER): Payer: Self-pay | Admitting: Gastroenterology

## 2023-10-22 DIAGNOSIS — K6389 Other specified diseases of intestine: Secondary | ICD-10-CM | POA: Diagnosis not present

## 2023-10-22 DIAGNOSIS — K921 Melena: Secondary | ICD-10-CM | POA: Diagnosis not present

## 2023-10-22 DIAGNOSIS — C189 Malignant neoplasm of colon, unspecified: Secondary | ICD-10-CM | POA: Diagnosis not present

## 2023-10-22 LAB — SURGICAL PATHOLOGY

## 2023-10-22 NOTE — Consult Note (Signed)
 [facility] Consultation Oncology  Name: William Francis      MRN: [id number]    Location: A331/A331-01  Date: 10/22/2023 Time:7:56 AM   REFERRING PHYSICIAN: Dr. Evonnie  REASON FOR CONSULT: Sigmoid colon mass   DIAGNOSIS: Sigmoid colon cancer  HISTORY OF PRESENT ILLNESS: William Francis is a 53 year old male seen in consultation today at the request of Dr. Evonnie for newly diagnosed sigmoid colon cancer.  He presented with few month history of hematochezia.  Never had a prior colonoscopy.  He was evaluated by Dr. Eartha and a colonoscopy on 10/17/2023 showed infiltrative and ulcerated nonobstructing large mass found in the sigmoid colon, partially circumferential, measured 3 cm in length.  Nonbleeding internal hemorrhoids were also seen.  Biopsy results are pending at this time.  CT angiogram of the abdomen and pelvis on 10/16/2023 showed small hypodense structures in the liver probably representing cysts.  A CT chest on 10/17/2023 showed multiple scattered low-attenuation lesions in bilateral lobes of the liver.  2 tiny nodules within the right lung measuring up to 3 mm.  MRI of the abdomen on 10/18/2023: Liver lesions were consistent with cysts.  No evidence of lymphadenopathy or metastatic disease in the abdomen.  He underwent exploratory laparotomy and sigmoidectomy on 10/20/2023.  He quit smoking in 2010.  Family history consistent with mother having cancer, type not known to the patient.  PAST MEDICAL HISTORY:   History reviewed. No pertinent past medical history.  ALLERGIES: Allergies  Allergen Reactions   Penicillins Anaphylaxis      MEDICATIONS: I have reviewed the patient's current medications.     PAST SURGICAL HISTORY Past Surgical History:  Procedure Laterality Date   PARTIAL COLECTOMY N/A 10/20/2023   Procedure: COLECTOMY, PARTIAL;  Surgeon: Evonnie Dorothyann LABOR, DO;  Location: [facility];  Service: General;  Laterality: N/A;    FAMILY HISTORY: History reviewed. No pertinent family  history.  SOCIAL HISTORY:  reports that he has never smoked. He has never been exposed to tobacco smoke. He does not have any smokeless tobacco history on file. He reports that he does not drink alcohol and does not use drugs.  PERFORMANCE STATUS: The patient's performance status is 1 - Symptomatic but completely ambulatory  PHYSICAL EXAM: Most Recent Vital Signs: Blood pressure 105/81, pulse 77, temperature 98.1 F (36.7 C), temperature source Oral, resp. rate 18, height 6' (1.829 m), weight 200 lb 6.4 oz (90.9 kg), SpO2 97%. BP 105/81 (BP Location: Right Arm)   Pulse 77   Temp 98.1 F (36.7 C) (Oral)   Resp 18   Ht 6' (1.829 m)   Wt 200 lb 6.4 oz (90.9 kg)   SpO2 97%   BMI 27.18 kg/m  General appearance: alert, cooperative, and appears stated age  LABORATORY DATA:  Results for orders placed or performed during the hospital encounter of 10/16/23 (from the past 48 hours)  Basic metabolic panel     Status: Abnormal   Collection Time: 10/21/23  4:33 AM  Result Value Ref Range   Sodium 136 135 - 145 mmol/L   Potassium 3.8 3.5 - 5.1 mmol/L   Chloride 103 98 - 111 mmol/L   CO2 22 22 - 32 mmol/L   Glucose, Bld 117 (H) 70 - 99 mg/dL    Comment: Glucose reference range applies only to samples taken after fasting for at least 8 hours.   BUN 8 6 - 20 mg/dL   Creatinine, Ser 9.07 0.61 - 1.24 mg/dL   Calcium 8.5 (L) 8.9 -  10.3 mg/dL   GFR, Estimated >39 >39 mL/min    Comment: (NOTE) Calculated using the CKD-EPI Creatinine Equation (2021)    Anion gap 11 5 - 15    Comment: Performed at [facility], [address] [place], [place] [address]  CBC     Status: Abnormal   Collection Time: 10/21/23  4:33 AM  Result Value Ref Range   WBC 10.5 4.0 - 10.5 K/uL   RBC 3.29 (L) 4.22 - 5.81 MIL/uL   Hemoglobin 10.3 (L) 13.0 - 17.0 g/dL   HCT 70.8 (L) 60.9 - 47.9 %   MCV 88.4 80.0 - 100.0 fL   MCH 31.3 26.0 - 34.0 pg   MCHC 35.4 30.0 - 36.0 g/dL   RDW 86.6 88.4 - 84.4 %   Platelets 307  150 - 400 K/uL   nRBC 0.0 0.0 - 0.2 %    Comment: Performed at [facility], [address], [place], [place] [address]      RADIOGRAPHY: No results found.     PATHOLOGY: Pending at this time.  ASSESSMENT and PLAN:  1.  Sigmoid colon cancer: - Presentation: Bleeding per rectum for few months. - Colonoscopy (10/17/2023): Infiltrative ulcerated nonobstructing large mass found in the sigmoid colon, partially circumferential measuring 3 cm in length.  Nonbleeding internal hemorrhoids. - CT angiogram of the abdomen and pelvis on 10/16/2023: Small hypodense structures in the liver. - CT chest (10/17/2023): Tiny 2 nodules in the right lung measuring up to 3 mm, nonspecific.  No other metastatic disease. - CEA (10/17/2023): 1.2 - MRI of the abdomen on 10/18/2023): Liver lesions are consistent with cysts.  No evidence of adenopathy. - 10/20/2023: Exploratory laparotomy and sigmoidectomy. - I have discussed findings on the scan and colonoscopy.  Pathology report is pending. - If it is confirmed to be adenocarcinoma, I have discussed the role for adjuvant chemotherapy if there is any lymph node involvement. - We will request MSI/MMR testing on the pathology.  Will also consider doing Signatera test.  If he has stage II disease, we will do surveillance.  All questions were answered. The patient knows to call the clinic with any problems, questions or concerns. We can certainly see the patient much sooner if necessary.    Vandella Ord

## 2023-10-22 NOTE — Progress Notes (Signed)
 1 yr TCS noted in recall No PCP

## 2023-10-22 NOTE — Progress Notes (Signed)
 PROGRESS NOTE    William Francis  FMW:[id number] DOB: 05/28/1970 DOA: 10/16/2023 PCP: Patient, No Pcp Per   Brief Narrative:    52 year old with no documented chronic medical issues presented from [facility] with hematochezia.  Patient was seen by GI and underwent colonoscopy on 10/17/2023 with noted sigmoid colon mass and underwent sigmoidectomy on 10/20/2023 and now appears to be tolerating diet.  Oncology has evaluated patient with plan for close follow-up in the next couple weeks.  Assessment & Plan:   Principal Problem:   Hematochezia Active Problems:   Colonic mass   Gastrointestinal hemorrhage   Colon adenocarcinoma (HCC)  Assessment and Plan:   Hematochezia/Lower GI bleed/Acute blood loss anemia secondary to sigmoid colon cancer status post exploratory laparotomy with sigmoidectomy postop day #2 - Appears to now be tolerating diet with stable lab work -Noted to have bowel movements -General Surgery to continue monitoring for 1 more day given the fact that he is incarcerated -Oncology has seen patient with plans to follow-up outpatient   Elevated blood pressure-improved - No history of hypertension - Monitor for now - overall normotensive   Liver Lesions -MR liver-- Multiple scattered fluid signal liver cysts corresponding to findings of prior CT. No solid lesion or suspicious contrast enhancement;  no concerning LN in abd    DVT prophylaxis: Lovenox Code Status: Full Family Communication: Guard at bedside 7/2 Disposition Plan:  Status is: Inpatient Remains inpatient appropriate because: Need for IV medications.   Consultants:  General surgery Oncology  Procedures:  Colonoscopy 6/27 Exploratory laparotomy with sigmoidectomy 6/30  Antimicrobials:  Anti-infectives (From admission, onward)    Start     Dose/Rate Route Frequency Ordered Stop   10/20/23 1630  clindamycin  (CLEOCIN ) IVPB 900 mg        900 mg 100 mL/hr over 30 Minutes  Intravenous Every 8 hours 10/20/23 1542 10/20/23 1758   10/20/23 1200  ertapenem (INVANZ) 1 g in sodium chloride  0.9 % 100 mL IVPB        1 g 200 mL/hr over 30 Minutes Intravenous On call to O.R. 10/20/23 0918 10/20/23 1306   10/19/23 2200  metroNIDAZOLE (FLAGYL) tablet 1,000 mg       Placed in Followed by Linked Group   1,000 mg Oral  Once 10/18/23 1216 10/19/23 2028   10/19/23 2200  neomycin (MYCIFRADIN) tablet 1,000 mg       Placed in Followed by Linked Group   1,000 mg Oral  Once 10/18/23 1216 10/19/23 2028   10/19/23 1500  metroNIDAZOLE (FLAGYL) tablet 1,000 mg       Placed in Followed by Linked Group   1,000 mg Oral  Once 10/18/23 1216 10/19/23 1705   10/19/23 1500  neomycin (MYCIFRADIN) tablet 1,000 mg       Placed in Followed by Linked Group   1,000 mg Oral  Once 10/18/23 1216 10/19/23 1705   10/19/23 1400  metroNIDAZOLE (FLAGYL) tablet 1,000 mg       Placed in Followed by Linked Group   1,000 mg Oral  Once 10/18/23 1216 10/19/23 1541   10/19/23 1400  neomycin (MYCIFRADIN) tablet 1,000 mg       Placed in Followed by Linked Group   1,000 mg Oral  Once 10/18/23 1216 10/19/23 1540       Subjective: Patient seen and evaluated today with no new acute complaints or concerns. No acute concerns or events noted overnight.  He appears to be tolerating diet well without any significant pain  and is having bowel movement and flatus.  Objective: Vitals:   10/21/23 0707 10/21/23 1256 10/21/23 2006 10/22/23 0440  BP:  123/79 123/74 105/81  Pulse:  96 86 77  Resp:   18 18  Temp:  98.4 F (36.9 C) 98 F (36.7 C) 98.1 F (36.7 C)  TempSrc:  Oral Oral Oral  SpO2:  96% 99% 97%  Weight: 99.1 kg 91.9 kg  90.9 kg  Height:        Intake/Output Summary (Last 24 hours) at 10/22/2023 1028 Last data filed at 10/21/2023 1700 Gross per 24 hour  Intake 120 ml  Output --  Net 120 ml   Filed Weights   10/21/23 0707 10/21/23 1256 10/22/23 0440  Weight: 99.1 kg 91.9 kg 90.9 kg     Examination:  General exam: Appears calm and comfortable  Respiratory system: Clear to auscultation. Respiratory effort normal. Cardiovascular system: S1 & S2 heard, RRR.  Gastrointestinal system: Abdomen is soft, incision C/D/I Central nervous system: Alert and awake Extremities: No edema Skin: No significant lesions noted Psychiatry: Flat affect.    Data Reviewed: I have personally reviewed following labs and imaging studies  CBC: Recent Labs  Lab 10/16/23 1034 10/16/23 1650 10/17/23 0942 10/17/23 1618 10/18/23 0502 10/19/23 0320 10/21/23 0433  WBC 6.4  --   --   --  7.1 5.9 10.5  NEUTROABS 2.8  --   --   --   --   --   --   HGB 14.2   < > 11.8* 10.9* 10.8* 10.5* 10.3*  HCT 40.8   < > 34.0* 31.5* 31.0* 31.7* 29.1*  MCV 88.7  --   --   --  89.3 89.5 88.4  PLT 306  --   --   --  258 265 307   < > = values in this interval not displayed.   Basic Metabolic Panel: Recent Labs  Lab 10/16/23 1034 10/17/23 0457 10/19/23 0320 10/21/23 0433  NA 140 139 136 136  K 3.9 3.6 3.6 3.8  CL 104 104 102 103  CO2 26 29 26 22   GLUCOSE 109* 112* 110* 117*  BUN 14 12 7 8   CREATININE 0.96 0.94 0.94 0.92  CALCIUM 9.0 8.6* 8.6* 8.5*  MG  --   --  2.1  --   PHOS  --   --  3.8  --    GFR: Estimated Creatinine Clearance: 105.4 mL/min (by C-G formula based on SCr of 0.92 mg/dL). Liver Function Tests: Recent Labs  Lab 10/16/23 1034  AST 19  ALT 21  ALKPHOS 62  BILITOT 0.5  PROT 7.3  ALBUMIN 4.1   Recent Labs  Lab 10/16/23 1034  LIPASE 25   No results for input(s): AMMONIA in the last 168 hours. Coagulation Profile: Recent Labs  Lab 10/17/23 0457  INR 1.1   Cardiac Enzymes: No results for input(s): CKTOTAL, CKMB, CKMBINDEX, TROPONINI in the last 168 hours. BNP (last 3 results) No results for input(s): PROBNP in the last 8760 hours. HbA1C: No results for input(s): HGBA1C in the last 72 hours. CBG: No results for input(s): GLUCAP in the last  168 hours. Lipid Profile: No results for input(s): CHOL, HDL, LDLCALC, TRIG, CHOLHDL, LDLDIRECT in the last 72 hours. Thyroid Function Tests: No results for input(s): TSH, T4TOTAL, FREET4, T3FREE, THYROIDAB in the last 72 hours. Anemia Panel: No results for input(s): VITAMINB12, FOLATE, FERRITIN, TIBC, IRON, RETICCTPCT in the last 72 hours. Sepsis Labs: No results for input(s): PROCALCITON, LATICACIDVEN  in the last 168 hours.  Recent Results (from the past 240 hours)  Surgical PCR screen     Status: None   Collection Time: 10/19/23  8:35 PM   Specimen: Nasal Mucosa; Nasal Swab  Result Value Ref Range Status   MRSA, PCR NEGATIVE NEGATIVE Final   Staphylococcus aureus NEGATIVE NEGATIVE Final    Comment: (NOTE) The Xpert SA Assay (FDA approved for NASAL specimens in patients 67 years of age and older), is one component of a comprehensive surveillance program. It is not intended to diagnose infection nor to guide or monitor treatment. Performed at [facility], [address], [place], [place] [address]       Radiology Studies: No results found.    Scheduled Meds:  acetaminophen   1,000 mg Oral Q6H   celecoxib  200 mg Oral BID   enoxaparin (LOVENOX) injection  40 mg Subcutaneous Q24H   feeding supplement  237 mL Oral BID BM   methocarbamol  500 mg Oral TID     LOS: 5 days    Time spent: 55 minutes    Addalyne Vandehei JONETTA Fairly, DO [facility]  If 7PM-7AM, please contact night-coverage [email or web address] 10/22/2023, 10:28 AM

## 2023-10-22 NOTE — Progress Notes (Signed)
 [facility] Progress Note  2 Days Post-Op  Subjective: Patient seen and examined.  He is resting comfortably in bed.  He is resting comfortably in bed.  He is tolerating his diet without nausea and vomiting.  He confirms passing flatus and has had several loose bowel movements.  He denies any blood in his bowel movements.  His pain is controlled with his medications.  Objective: Vital signs in last 24 hours: Temp:  [98 F (36.7 C)-98.4 F (36.9 C)] 98.1 F (36.7 C) (07/02 0440) Pulse Rate:  [77-96] 77 (07/02 0440) Resp:  [18] 18 (07/02 0440) BP: (105-123)/(74-81) 105/81 (07/02 0440) SpO2:  [96 %-99 %] 97 % (07/02 0440) Weight:  [90.9 kg-91.9 kg] 90.9 kg (07/02 0440) Last BM Date : 10/19/23  Intake/Output from previous day: 07/01 0701 - 07/02 0700 In: 120 [P.O.:120] Out: -  Intake/Output this shift: No intake/output data recorded.  General appearance: alert, cooperative, and no distress GI: Abdomen soft, nondistended, no percussion tenderness, nontender to palpation; no rigidity, guarding, rebound tenderness; midline incision C/D/I with honeycomb dressing in place  Lab Results:  Recent Labs    10/21/23 0433  WBC 10.5  HGB 10.3*  HCT 29.1*  PLT 307   BMET Recent Labs    10/21/23 0433  NA 136  K 3.8  CL 103  CO2 22  GLUCOSE 117*  BUN 8  CREATININE 0.92  CALCIUM 8.5*   PT/INR No results for input(s): LABPROT, INR in the last 72 hours.  Studies/Results: No results found.  Anti-infectives: Anti-infectives (From admission, onward)    Start     Dose/Rate Route Frequency Ordered Stop   10/20/23 1630  clindamycin  (CLEOCIN ) IVPB 900 mg        900 mg 100 mL/hr over 30 Minutes Intravenous Every 8 hours 10/20/23 1542 10/20/23 1758   10/20/23 1200  ertapenem (INVANZ) 1 g in sodium chloride  0.9 % 100 mL IVPB        1 g 200 mL/hr over 30 Minutes Intravenous On call to O.R. 10/20/23 0918 10/20/23 1306   10/19/23 2200  metroNIDAZOLE (FLAGYL)  tablet 1,000 mg       Placed in Followed by Linked Group   1,000 mg Oral  Once 10/18/23 1216 10/19/23 2028   10/19/23 2200  neomycin (MYCIFRADIN) tablet 1,000 mg       Placed in Followed by Linked Group   1,000 mg Oral  Once 10/18/23 1216 10/19/23 2028   10/19/23 1500  metroNIDAZOLE (FLAGYL) tablet 1,000 mg       Placed in Followed by Linked Group   1,000 mg Oral  Once 10/18/23 1216 10/19/23 1705   10/19/23 1500  neomycin (MYCIFRADIN) tablet 1,000 mg       Placed in Followed by Linked Group   1,000 mg Oral  Once 10/18/23 1216 10/19/23 1705   10/19/23 1400  metroNIDAZOLE (FLAGYL) tablet 1,000 mg       Placed in Followed by Linked Group   1,000 mg Oral  Once 10/18/23 1216 10/19/23 1541   10/19/23 1400  neomycin (MYCIFRADIN) tablet 1,000 mg       Placed in Followed by Linked Group   1,000 mg Oral  Once 10/18/23 1216 10/19/23 1540       Assessment/Plan:  [facility] Protocol Daily Progress Note   POD#2 s/p sigmoidectomy Diet: GI soft IVF: Hep-Locked 7/1 Foley: Removed 6/30 BM/Flatus: Passing flatus and having loose bowel movements Entereg: Discontinued today Ambulation within 24 hours of surgery: Yes Post-op antibiotics: 24  hour course of clindamycin  completed Multimodal pain management: Scheduled Robaxin, Tylenol , Celebrex, and gabapentin; as needed oxycodone  and Dilaudid -Continue SCDs and Lovenox -No leukocytosis, and hemoglobin stable -Monitor for return of bowel function -Oncology has been requested to see the patient given his incarceration status -Await final pathology -Appreciate hospitalist recommendations.  Anticipate patient will be stable for discharge tomorrow   LOS: 5 days    Artis Beggs A Stran Raper 10/22/2023  Note: Portions of this report [place] have been transcribed using voice recognition software. Every effort has been made to ensure accuracy; however, inadvertent computerized transcription errors [place] still be present.

## 2023-10-23 DIAGNOSIS — K921 Melena: Secondary | ICD-10-CM | POA: Diagnosis not present

## 2023-10-23 LAB — CBC
HCT: 29.3 % — ABNORMAL LOW (ref 39.0–52.0)
Hemoglobin: 9.6 g/dL — ABNORMAL LOW (ref 13.0–17.0)
MCH: 29.8 pg (ref 26.0–34.0)
MCHC: 32.8 g/dL (ref 30.0–36.0)
MCV: 91 fL (ref 80.0–100.0)
Platelets: 290 10*3/uL (ref 150–400)
RBC: 3.22 MIL/uL — ABNORMAL LOW (ref 4.22–5.81)
RDW: 13.7 % (ref 11.5–15.5)
WBC: 5.9 10*3/uL (ref 4.0–10.5)
nRBC: 0 % (ref 0.0–0.2)

## 2023-10-23 LAB — MAGNESIUM: Magnesium: 2.2 mg/dL (ref 1.7–2.4)

## 2023-10-23 LAB — SURGICAL PATHOLOGY

## 2023-10-23 LAB — BASIC METABOLIC PANEL WITH GFR
Anion gap: 8 (ref 5–15)
BUN: 11 mg/dL (ref 6–20)
CO2: 27 mmol/L (ref 22–32)
Calcium: 8.6 mg/dL — ABNORMAL LOW (ref 8.9–10.3)
Chloride: 105 mmol/L (ref 98–111)
Creatinine, Ser: 0.94 mg/dL (ref 0.61–1.24)
GFR, Estimated: >60 mL/min (ref >60)
Glucose, Bld: 105 mg/dL — ABNORMAL HIGH (ref 70–99)
Potassium: 3.9 mmol/L (ref 3.5–5.1)
Sodium: 140 mmol/L (ref 135–145)

## 2023-10-23 MED ORDER — ACETAMINOPHEN 500 MG PO TABS: 1000.0000 mg | ORAL_TABLET | Freq: Four times a day (QID) | ORAL | 0 refills | Status: AC

## 2023-10-23 MED ORDER — CELECOXIB 200 MG PO CAPS: 200.0000 mg | ORAL_CAPSULE | Freq: Two times a day (BID) | ORAL | 0 refills | Status: AC

## 2023-10-23 MED ORDER — ONDANSETRON HCL 4 MG PO TABS: 4.0000 mg | ORAL_TABLET | Freq: Four times a day (QID) | ORAL | 0 refills | Status: AC | PRN

## 2023-10-23 MED ORDER — OXYCODONE HCL 5 MG PO TABS: 5.0000 mg | ORAL_TABLET | Freq: Four times a day (QID) | ORAL | 0 refills | Status: AC | PRN

## 2023-10-23 MED ORDER — METHOCARBAMOL 500 MG PO TABS: 500.0000 mg | ORAL_TABLET | Freq: Three times a day (TID) | ORAL | 0 refills | Status: AC

## 2023-10-23 NOTE — Progress Notes (Addendum)
 [facility] Progress Note  3 Days Post-Op  Subjective: Patient seen and examined.  He is resting comfortably in bed.  He is resting comfortably in bed.  He is tolerating his diet without nausea and vomiting.  He confirms passing flatus, and he last had a bowel movement this morning.  His pain is controlled with oral medications.  Objective: Vital signs in last 24 hours: Temp:  [97.8 F (36.6 C)-98.5 F (36.9 C)] 97.8 F (36.6 C) (07/03 0541) Pulse Rate:  [72-93] 79 (07/03 0541) Resp:  [17-20] 20 (07/03 0541) BP: (122-130)/(80-93) 130/93 (07/03 0541) SpO2:  [97 %-99 %] 98 % (07/03 0541) Weight:  [90.5 kg] 90.5 kg (07/03 0541) Last BM Date : 10/21/23  Intake/Output from previous day: 07/02 0701 - 07/03 0700 In: 240 [P.O.:240] Out: -  Intake/Output this shift: No intake/output data recorded.  General appearance: alert, cooperative, and no distress GI: Abdomen soft, nondistended, no percussion tenderness, nontender to palpation; no rigidity, guarding, rebound tenderness; midline incision C/D/I with honeycomb dressing in place  Lab Results:  Recent Labs    10/21/23 0433 10/23/23 0418  WBC 10.5 5.9  HGB 10.3* 9.6*  HCT 29.1* 29.3*  PLT 307 290   BMET Recent Labs    10/21/23 0433 10/23/23 0418  NA 136 140  K 3.8 3.9  CL 103 105  CO2 22 27  GLUCOSE 117* 105*  BUN 8 11  CREATININE 0.92 0.94  CALCIUM 8.5* 8.6*   PT/INR No results for input(s): LABPROT, INR in the last 72 hours.  Studies/Results: No results found.  Anti-infectives: Anti-infectives (From admission, onward)    Start     Dose/Rate Route Frequency Ordered Stop   10/20/23 1630  clindamycin  (CLEOCIN ) IVPB 900 mg        900 mg 100 mL/hr over 30 Minutes Intravenous Every 8 hours 10/20/23 1542 10/20/23 1758   10/20/23 1200  ertapenem (INVANZ) 1 g in sodium chloride  0.9 % 100 mL IVPB        1 g 200 mL/hr over 30 Minutes Intravenous On call to O.R. 10/20/23 0918 10/20/23 1306    10/19/23 2200  metroNIDAZOLE (FLAGYL) tablet 1,000 mg       Placed in Followed by Linked Group   1,000 mg Oral  Once 10/18/23 1216 10/19/23 2028   10/19/23 2200  neomycin (MYCIFRADIN) tablet 1,000 mg       Placed in Followed by Linked Group   1,000 mg Oral  Once 10/18/23 1216 10/19/23 2028   10/19/23 1500  metroNIDAZOLE (FLAGYL) tablet 1,000 mg       Placed in Followed by Linked Group   1,000 mg Oral  Once 10/18/23 1216 10/19/23 1705   10/19/23 1500  neomycin (MYCIFRADIN) tablet 1,000 mg       Placed in Followed by Linked Group   1,000 mg Oral  Once 10/18/23 1216 10/19/23 1705   10/19/23 1400  metroNIDAZOLE (FLAGYL) tablet 1,000 mg       Placed in Followed by Linked Group   1,000 mg Oral  Once 10/18/23 1216 10/19/23 1541   10/19/23 1400  neomycin (MYCIFRADIN) tablet 1,000 mg       Placed in Followed by Linked Group   1,000 mg Oral  Once 10/18/23 1216 10/19/23 1540      Pathology: FINAL MICROSCOPIC DIAGNOSIS:  A. SIGMOID COLON, MASS, SEGMENTAL RESECTION: Invasive moderately differentiated adenocarcinoma invading through muscularis propria (pT3) Tumor measures 4.0 x 3.1 x 0.6 cm Margins free One of 16 pericolic lymph  nodes with metastatic carcinoma (pN1a)  B. ADDITIONAL DISTAL AND PROXIMAL MARGINS, ANATOMOSIS, EXCISION: Benign colon  ONCOLOGY TABLE:  COLON AND RECTUM, CARCINOMA:  Resection  Procedure: Segmental resection Tumor Site: Sigmoid colon Tumor Size: 4.0 x 3.1 x 0.6 cm Macroscopic Tumor Perforation: Not identified Histologic Type: Adenocarcinoma Histologic Grade: Moderately differentiated Multiple Primary Sites: Not applicable Tumor Extension: Tumor invades through muscularis propria Lymphovascular Invasion: Not identified Perineural Invasion: Not identified Treatment Effect: No known presurgical therapy Margins:      Margin Status for Invasive Carcinoma: All margins negative for invasive carcinoma      Distance from Invasive Carcinoma to Radial  (Circumferential) Margin: 6.2 cm from mesenteric margin      Distance from Invasive Carcinoma to Closest Mucosal Margin: 3.2 cm from proximal margin and 3.7 cm from distal margin (final margins are 3.5 cm and 4.2 cm respectively incorporating the donut dimensions)      Margin Status for Non-Invasive Tumor: All margins negative for dysplasia Regional Lymph Nodes:      Number of Lymph Nodes with Tumor: 1      Number of Lymph Nodes Examined: 16 Tumor Deposits: Not identified Distant Metastasis: Not applicable Pathologic Stage Classification (pTNM, AJCC 8th Edition): pT3, pN1a Ancillary Studies: MMR testing has been ordered; these results will be issued within an addendum Representative Tumor Block: A6, A7 Comments: None (v4.2.0.1)   **Clinically, margins were negative intra-operatively as documented in the operative note, and it is known that tissue specimens will shrink after removal from the body.**  Assessment/Plan:  [facility] Protocol Daily Progress Note   POD#3 s/p sigmoidectomy Diet: GI soft IVF: Hep-Locked 7/1 Foley: Removed 6/30 BM/Flatus: Passing flatus and having loose bowel movements Entereg: Discontinued 7/2 Ambulation within 24 hours of surgery: Yes Post-op antibiotics: 24 hour course of clindamycin  completed Multimodal pain management: Scheduled Robaxin, Tylenol , Celebrex, and gabapentin; as needed oxycodone  and Dilaudid -Continue SCDs and Lovenox -No leukocytosis, and hemoglobin stable -Monitor for return of bowel function.  Patient continues to have bowel function -Appreciate oncology recommendations -Discussed results of patient's pathology with the patient and Asberry Irving, LPN [phone]) -Appreciate hospitalist recommendations. Stable for discharge today from a surgical standpoint   LOS: 6 days    Carmen Tolliver A Laveyah Oriol 10/23/2023  Note: Portions of this report [place] have been transcribed using voice recognition software. Every effort has been made to  ensure accuracy; however, inadvertent computerized transcription errors [place] still be present.

## 2023-10-23 NOTE — Discharge Summary (Signed)
 Physician Discharge Summary  [name] FMW:[id number] DOB: 1970-09-14 DOA: 10/16/2023  PCP: William Francis, No Pcp Per  Admit date: 10/16/2023  Discharge date: 10/23/2023  [facility]  Disposition:  [facility]  Recommendations for Outpatient Follow-up:  Follow up with general surgery for staple removal on 7/16 as scheduled Follow-up with Dr. Davonna oncology as scheduled on 7/17 at 11:15 AM Remain on pain medications as prescribed  Home Health: None  Equipment/Devices: None  Discharge Condition:Stable  CODE STATUS: Full  Diet recommendation: Soft  Brief/Interim Summary: 53 year old with no documented chronic medical issues presented from [facility] with hematochezia.  William Francis was seen by GI and underwent colonoscopy on 10/17/2023 with noted sigmoid colon mass and underwent sigmoidectomy on 10/20/2023 and now appears to be tolerating diet.  Oncology has evaluated William Francis with plan for close follow-up in the next couple weeks.  William Francis will also follow-up with general surgery in the near future for staple removal.  No other acute events or concerns noted during the course of this admission.  Discharge Diagnoses:  Principal Problem:   Hematochezia Active Problems:   Colonic mass   Gastrointestinal hemorrhage   Colon adenocarcinoma [facility])  Principal discharge diagnosis: Newly diagnosed colon adenocarcinoma status post exploratory laparotomy with sigmoidectomy.  Discharge Instructions  Discharge Instructions     Diet - low sodium heart healthy   Complete by: As directed    If the dressing is still on your incision site when you go home, remove it on the third day after your surgery date. Remove dressing if it begins to fall off, or if it is dirty or damaged before the third day.   Complete by: As directed    Increase activity slowly   Complete by: As directed       Allergies as of 10/23/2023       Reactions   Penicillins Anaphylaxis         Medication List     TAKE these medications    acetaminophen  500 MG tablet Commonly known as: TYLENOL  Take 2 tablets (1,000 mg total) by mouth every 6 (six) hours for 7 days.   celecoxib 200 MG capsule Commonly known as: CELEBREX Take 1 capsule (200 mg total) by mouth 2 (two) times daily for 7 days.   methocarbamol 500 MG tablet Commonly known as: ROBAXIN Take 1 tablet (500 mg total) by mouth 3 (three) times daily for 7 days.   ondansetron  4 MG tablet Commonly known as: ZOFRAN  Take 1 tablet (4 mg total) by mouth every 6 (six) hours as needed for nausea.   oxyCODONE  5 MG immediate release tablet Commonly known as: Oxy IR/ROXICODONE  Take 1 tablet (5 mg total) by mouth every 6 (six) hours as needed for moderate pain (pain score 4-6).               Discharge Care Instructions  (From admission, onward)           Start     Ordered   10/23/23 0000  If the dressing is still on your incision site when you go home, remove it on the third day after your surgery date. Remove dressing if it begins to fall off, or if it is dirty or damaged before the third day.        10/23/23 1015            Follow-up Information     Pappayliou, Dorothyann A, DO. Schedule an appointment as soon as possible for a visit on 11/05/2023.  Specialty: General Surgery Why: Make an appointment for skin staple removal Contact information: [address] [place] [facility] [address] [phone]         Davonna Siad, MD. Go on 11/06/2023.   Specialty: Oncology Why: Go to your scheduled appointment on 7/17 at 11:15 AM Contact information: [address] [place] [address] [phone]                Allergies  Allergen Reactions   Penicillins Anaphylaxis    Consultations: General Surgery Oncology   Procedures/Studies: MR ABDOMEN W WO CONTRAST Result Date: 10/18/2023 CLINICAL DATA:  Liver lesions suspected by prior CT, new diagnosis colon cancer EXAM: MRI ABDOMEN  WITHOUT AND WITH CONTRAST TECHNIQUE: Multiplanar multisequence MR imaging of the abdomen was performed both before and after the administration of intravenous contrast. CONTRAST:  9mL GADAVIST GADOBUTROL 1 MMOL/ML IV SOLN COMPARISON:  CT abdomen pelvis, 10/16/2023 FINDINGS: Lower chest: No acute abnormality. Hepatobiliary: No solid liver abnormality is seen. Multiple scattered fluid signal liver cysts. No solid lesion or suspicious contrast enhancement. No gallstones, gallbladder wall thickening, or biliary dilatation. Pancreas: Unremarkable. No pancreatic ductal dilatation or surrounding inflammatory changes. Spleen: Normal in size without significant abnormality. Adrenals/Urinary Tract: Adrenal glands are unremarkable. Kidneys are normal, without renal calculi, solid lesion, or hydronephrosis. Stomach/Bowel: Stomach is within normal limits. Sigmoid colon mass is partially included on selected coronal sequences (series 3, image 25). Vascular/Lymphatic: No significant vascular findings are present. No enlarged abdominal lymph nodes. Other: No abdominal wall hernia or abnormality. No ascites. Musculoskeletal: No acute or significant osseous findings. IMPRESSION: 1. Multiple scattered fluid signal liver cysts corresponding to findings of prior CT. No solid lesion or suspicious contrast enhancement. 2. No evidence of lymphadenopathy or metastatic disease in the abdomen. 3. Known sigmoid colon mass is partially included on selected coronal sequences. Electronically Signed   By: Marolyn JONETTA Jaksch M.D.   On: 10/18/2023 20:26   CT CHEST W CONTRAST Result Date: 10/18/2023 CLINICAL DATA:  Colon cancer.  Staging. * Tracking Code: BO * EXAM: CT CHEST WITH CONTRAST TECHNIQUE: Multidetector CT imaging of the chest was performed during intravenous contrast administration. RADIATION DOSE REDUCTION: This exam was performed according to the departmental dose-optimization program which includes automated exposure control, adjustment  of the mA and/or kV according to William Francis size and/or use of iterative reconstruction technique. CONTRAST:  75mL OMNIPAQUE  IOHEXOL  300 MG/ML  SOLN COMPARISON:  None Available. FINDINGS: Cardiovascular: Heart size is normal. No pericardial effusion. No significant aortic or coronary artery atherosclerotic calcifications. Mediastinum/Nodes: No enlarged mediastinal, hilar, or axillary lymph nodes. Thyroid gland, trachea, and esophagus demonstrate no significant findings. Lungs/Pleura: No pleural effusion, airspace consolidation, atelectasis, or pneumothorax. The central airways are grossly patent. Tiny nodule within the right upper lobe measures 3 mm, image 49/3. In the posterior right lower lobe there is a 2 mm nodule, image 23/3. Upper Abdomen: No acute abnormality. Multiple scattered low-attenuation lesions within both lobes of liver. The largest lesion is in the right lobe of liver measuring 1.4 x 0.8 cm and fluid attenuation, image 163/2. The remaining are less than 1 cm in technically too small to characterize. Musculoskeletal: No chest wall abnormality. No acute or significant osseous findings. IMPRESSION: 1. No acute cardiopulmonary abnormalities. No highly suspicious findings to suggest metastatic disease to the chest. 2. There are 2 tiny nodules within the right lung measuring up to 3 mm. These are nonspecific. Attention on future surveillance imaging is advised in this William Francis with newly diagnosed colon cancer. 3.  Multiple scattered low-attenuation lesions within both lobes of liver. Most of these are less than 1 cm and are technically too small to reliably characterize. The largest lesion is in the right lobe of liver measuring 1.4 x 0.8 cm. These are favored to represent benign cysts or hemangiomas. In a William Francis with newly diagnosed colon cancer consider more definitive characterization of the subcentimeter lesions with nonemergent dedicated abdominal MRI with and without contrast. Electronically Signed    By: Waddell Calk M.D.   On: 10/18/2023 04:41   CT Angio Abd/Pel W and/or Wo Contrast Result Date: 10/16/2023 CLINICAL DATA:  Lower GI bleed.  Hematochezia. EXAM: CTA ABDOMEN AND PELVIS WITHOUT AND WITH CONTRAST TECHNIQUE: Multidetector CT imaging of the abdomen and pelvis was performed using the standard protocol during bolus administration of intravenous contrast. Multiplanar reconstructed images and MIPs were obtained and reviewed to evaluate the vascular anatomy. RADIATION DOSE REDUCTION: This exam was performed according to the departmental dose-optimization program which includes automated exposure control, adjustment of the mA and/or kV according to William Francis size and/or use of iterative reconstruction technique. CONTRAST:  OMNIPAQUE  IOHEXOL  350 MG/ML SOLN COMPARISON:  CT abdomen pelvis 04/20/2013 FINDINGS: VASCULAR Aorta: Normal caliber aorta without aneurysm, dissection, vasculitis or significant stenosis. Celiac: Mild narrowing of the proximal celiac trunk and the configuration is suggestive for median arcuate ligament compression. Main branches of the celiac trunk are patent. No evidence for aneurysm or dissection. SMA: Patent without evidence of aneurysm, dissection, vasculitis or significant stenosis. Renals: Both renal arteries are patent without evidence of aneurysm, dissection, vasculitis, fibromuscular dysplasia or significant stenosis. IMA: Patent without evidence of aneurysm, dissection, vasculitis or significant stenosis. Inflow: Patent without evidence of aneurysm, dissection, vasculitis or significant stenosis. Proximal Outflow: Proximal femoral arteries are patent bilaterally. Veins: IVC and iliac veins are patent. Renal veins are patent. Main portal venous system is patent. Prominent venous structures in the pelvis. Review of the MIP images confirms the above findings. NON-VASCULAR Lower chest: Mild motion artifact at the lung bases. No significant consolidation at the lung bases. No  pleural effusions. Hepatobiliary: Small hypodensities in liver likely represent hepatic cysts but some are too small to definitively characterize. Normal appearance of the gallbladder. No biliary dilatation. Pancreas: Unremarkable. No pancreatic ductal dilatation or surrounding inflammatory changes. Spleen: Normal in size without focal abnormality. Adrenals/Urinary Tract: Normal adrenal glands. Negative for kidney stones. No hydronephrosis. Normal urinary bladder. No suspicious renal lesions. Stomach/Bowel: Normal stomach. Evidence for contrast extravasation on both the arterial and venous phase imaging in the sigmoid colon located in the right lower abdomen. This finding is best seen on image 163/10. There is mild colonic wall thickening adjacent to the area of bleeding, best seen on the venous phase imaging, image 66, sequence 16. Normal appearance of the small bowel. Question previous appendectomy. No significant colonic diverticula. Lymphatic: No significant lymph node enlargement in the abdomen and pelvis but there is a mildly prominent lymph node or lymph nodes near the sigmoid colon wall thickening and bleeding on image 72/19 and image 89/20. Reproductive: Focal low-density area in the central aspect of the prostate on image 93/16 and question previous intervention in this area such as the TURP. Prostate size appears to be within normal limits. Other: Right inguinal hernia containing fat. Negative for free fluid. Small umbilical hernia containing fat. Musculoskeletal: No acute bone abnormality. No suspicious osseous lesions. IMPRESSION: 1. Positive for active GI bleeding in the sigmoid colon. 2. Mild colonic wall thickening adjacent to the area of  bleeding. GI bleeding etiology is uncertain. Although this could be associated with a diverticular bleed, cannot exclude an underlying colonic neoplasm. In addition, there is a mildly prominent lymph node near the sigmoid colon that is nonspecific. 3. Small  hypodense structures in liver probably represent hepatic cysts. However, depending on the etiology for the GI bleeding, this Guarino need further characterization. 4. Mild narrowing of the proximal celiac trunk and the configuration is suggestive for median arcuate ligament compression. 5. Right inguinal hernia containing fat. Small umbilical hernia containing fat. 6. Focal low-density area in the prostate and question previous surgical intervention in this area. These results were called by telephone at the time of interpretation on 10/16/2023 at 12:47 pm to provider CHRISTOPHER RIGNEY , who verbally acknowledged these results. Electronically Signed   By: Juliene Balder M.D.   On: 10/16/2023 12:49     Discharge Exam: Vitals:   10/22/23 1956 10/23/23 0541  BP: 124/80 (!) 130/93  Pulse: 72 79  Resp: 20 20  Temp: 98.5 F (36.9 C) 97.8 F (36.6 C)  SpO2: 97% 98%   Vitals:   10/22/23 0440 10/22/23 1355 10/22/23 1956 10/23/23 0541  BP: 105/81 122/88 124/80 (!) 130/93  Pulse: 77 93 72 79  Resp: 18 17 20 20   Temp: 98.1 F (36.7 C) 98.3 F (36.8 C) 98.5 F (36.9 C) 97.8 F (36.6 C)  TempSrc: Oral Oral Oral Oral  SpO2: 97% 99% 97% 98%  Weight: 90.9 kg   90.5 kg  Height:        General: Pt is alert, awake, not in acute distress Cardiovascular: RRR, S1/S2 +, no rubs, no gallops Respiratory: CTA bilaterally, no wheezing, no rhonchi Abdominal: Soft, NT, ND, bowel sounds +, abdominal incision C/D/I Extremities: no edema, no cyanosis    The results of significant diagnostics from this hospitalization (including imaging, microbiology, ancillary and laboratory) are listed below for reference.     Microbiology: Recent Results (from the past 240 hours)  Surgical PCR screen     Status: None   Collection Time: 10/19/23  8:35 PM   Specimen: Nasal Mucosa; Nasal Swab  Result Value Ref Range Status   MRSA, PCR NEGATIVE NEGATIVE Final   Staphylococcus aureus NEGATIVE NEGATIVE Final    Comment:  (NOTE) The Xpert SA Assay (FDA approved for NASAL specimens in patients 67 years of age and older), is one component of a comprehensive surveillance program. It is not intended to diagnose infection nor to guide or monitor treatment. Performed at [facility], [address], [place], [place] [address]      Labs: BNP (last 3 results) No results for input(s): BNP in the last 8760 hours. Basic Metabolic Panel: Recent Labs  Lab 10/16/23 1034 10/17/23 0457 10/19/23 0320 10/21/23 0433 10/23/23 0418  NA 140 139 136 136 140  K 3.9 3.6 3.6 3.8 3.9  CL 104 104 102 103 105  CO2 26 29 26 22 27   GLUCOSE 109* 112* 110* 117* 105*  BUN 14 12 7 8 11   CREATININE 0.96 0.94 0.94 0.92 0.94  CALCIUM 9.0 8.6* 8.6* 8.5* 8.6*  MG  --   --  2.1  --  2.2  PHOS  --   --  3.8  --   --    Liver Function Tests: Recent Labs  Lab 10/16/23 1034  AST 19  ALT 21  ALKPHOS 62  BILITOT 0.5  PROT 7.3  ALBUMIN 4.1   Recent Labs  Lab 10/16/23 1034  LIPASE 25  No results for input(s): AMMONIA in the last 168 hours. CBC: Recent Labs  Lab 10/16/23 1034 10/16/23 1650 10/17/23 1618 10/18/23 0502 10/19/23 0320 10/21/23 0433 10/23/23 0418  WBC 6.4  --   --  7.1 5.9 10.5 5.9  NEUTROABS 2.8  --   --   --   --   --   --   HGB 14.2   < > 10.9* 10.8* 10.5* 10.3* 9.6*  HCT 40.8   < > 31.5* 31.0* 31.7* 29.1* 29.3*  MCV 88.7  --   --  89.3 89.5 88.4 91.0  PLT 306  --   --  258 265 307 290   < > = values in this interval not displayed.   Cardiac Enzymes: No results for input(s): CKTOTAL, CKMB, CKMBINDEX, TROPONINI in the last 168 hours. BNP: Invalid input(s): POCBNP CBG: No results for input(s): GLUCAP in the last 168 hours. D-Dimer No results for input(s): DDIMER in the last 72 hours. Hgb A1c No results for input(s): HGBA1C in the last 72 hours. Lipid Profile No results for input(s): CHOL, HDL, LDLCALC, TRIG, CHOLHDL, LDLDIRECT in the last 72 hours. Thyroid  function studies No results for input(s): TSH, T4TOTAL, T3FREE, THYROIDAB in the last 72 hours.  Invalid input(s): FREET3 Anemia work up No results for input(s): VITAMINB12, FOLATE, FERRITIN, TIBC, IRON, RETICCTPCT in the last 72 hours. Urinalysis    Component Value Date/Time   COLORURINE YELLOW 10/16/2023 1016   APPEARANCEUR CLEAR 10/16/2023 1016   LABSPEC 1.023 10/16/2023 1016   PHURINE 5.0 10/16/2023 1016   GLUCOSEU NEGATIVE 10/16/2023 1016   HGBUR SMALL (A) 10/16/2023 1016   BILIRUBINUR NEGATIVE 10/16/2023 1016   KETONESUR NEGATIVE 10/16/2023 1016   PROTEINUR NEGATIVE 10/16/2023 1016   UROBILINOGEN 0.2 04/20/2013 1424   NITRITE NEGATIVE 10/16/2023 1016   LEUKOCYTESUR NEGATIVE 10/16/2023 1016   Sepsis Labs Recent Labs  Lab 10/18/23 0502 10/19/23 0320 10/21/23 0433 10/23/23 0418  WBC 7.1 5.9 10.5 5.9   Microbiology Recent Results (from the past 240 hours)  Surgical PCR screen     Status: None   Collection Time: 10/19/23  8:35 PM   Specimen: Nasal Mucosa; Nasal Swab  Result Value Ref Range Status   MRSA, PCR NEGATIVE NEGATIVE Final   Staphylococcus aureus NEGATIVE NEGATIVE Final    Comment: (NOTE) The Xpert SA Assay (FDA approved for NASAL specimens in patients 62 years of age and older), is one component of a comprehensive surveillance program. It is not intended to diagnose infection nor to guide or monitor treatment. Performed at [facility], [address], [place], [place] [address]      Time coordinating discharge: 35 minutes  SIGNED:   Adron JONETTA Fairly, DO [facility] 10/23/2023, 10:20 AM  If 7PM-7AM, please contact night-coverage [email or web address]

## 2023-10-23 NOTE — Discharge Instructions (Signed)
 Surgery Discharge Instructions  Activity  Resume light activity. No heavy lifting over 10 lbs or strenuous exercise.  Fluids and Diet Regular diet  Medications  If you have not had a bowel movement in 24 hours, take 2 tablespoons over the counter Milk of mag.             You Berkland resume your blood thinners tomorrow (Aspirin, coumadin, or other).  You are being discharged with prescriptions for Opioid/Narcotic Medications: There are some specific considerations for these medications that you should know. Opioid Meds have risks & benefits. Addiction to these meds is always a concern with prolonged use Take medication only as directed Do not drive while taking narcotic pain medication Do not crush tablets or capsules Do not use a different container than medication was dispensed in Lock the container of medication in a cool, dry place out of reach of children and pets. Opioid medication can cause addiction Do not share with anyone else (this is a felony) Do not store medications for future use. Dispose of them properly.     Disposal:  Find a [place]  household drug take back site near you.  If you can't get to a drug take back site, use the recipe below as a last resort to dispose of expired, unused or unwanted drugs. Disposal  (Do not dispose chemotherapy drugs this way, talk to your prescribing doctor instead.) Step 1: Mix drugs (do not crush) with dirt, kitty litter, or used coffee grounds and add a small amount of water to dissolve any solid medications. Step 2: Seal drugs in plastic bag. Step 3: Place plastic bag in trash. Step 4: Take prescription container and scratch out personal information, then recycle or throw away.  Operative Site  You have skin staples in place.  These will be removed at your follow up visit on 7/16. Ok to English as a second language teacher. Keep wound clean and dry. No baths or swimming. No lifting more than 10 pounds.  Contact Information: If you have questions or  concerns, please call our office, [phone], Monday- Thursday 8AM-5PM and Friday 8AM-12Noon.  If it is after hours or on the weekend, please call [facility] Main Number, [phone], and ask to speak to the surgeon on call for Dr. Evonnie at [facility].   SPECIFIC COMPLICATIONS TO WATCH FOR: Inability to urinate Fever over 101? F by mouth Nausea and vomiting lasting longer than 24 hours. Pain not relieved by medication ordered Swelling around the operative site Increased redness, warmth, hardness, around operative area Numbness, tingling, or cold fingers or toes Blood -soaked dressing, (small amounts of oozing Adee be normal) Increasing and progressive drainage from surgical area or exam site

## 2023-10-23 NOTE — Plan of Care (Signed)
   Problem: Education: Goal: Knowledge of General Education information will improve Description Including pain rating scale, medication(s)/side effects and non-pharmacologic comfort measures Outcome: Progressing   Problem: Health Behavior/Discharge Planning: Goal: Ability to manage health-related needs will improve Outcome: Progressing

## 2023-10-23 NOTE — TOC Transition Note (Signed)
 Transition of Care [facility]) - Discharge Note   Patient Details  Name: William Francis MRN: [id number] Date of Birth: 04-05-1971  [facility] CM/SW Contact:  Sharlyne Stabs, RN Phone Number: 10/23/2023, 10:24 AM   Clinical Narrative:   Patient medically stable to return to Detention facility, RN to call report. Officers to transport. No TOC needs.       Barriers to Discharge: No Barriers Identified   Patient Goals and CMS Choice Patient states their goals for this hospitalization and ongoing recovery are:: return to detention CMS Medicare.gov Compare Post Acute Care list provided to:: Patient        Discharge Placement                       Discharge Plan and Services Additional resources added to the After Visit Summary for                                       Social Drivers of Health (SDOH) Interventions SDOH Screenings   Food Insecurity: No Food Insecurity (10/16/2023)  Housing: Low Risk  (10/16/2023)  Transportation Needs: No Transportation Needs (10/16/2023)  Utilities: Not At Risk (10/16/2023)  Tobacco Use: Unknown (10/20/2023)     Readmission Risk Interventions     No data to display

## 2023-10-27 NOTE — Anesthesia Postprocedure Evaluation (Signed)
 Anesthesia Post Note  Patient: William Francis  Procedure(s) Performed: COLONOSCOPY  Patient location during evaluation: Phase II Anesthesia Type: General Level of consciousness: awake Pain management: pain level controlled Vital Signs Assessment: post-procedure vital signs reviewed and stable Respiratory status: spontaneous breathing and respiratory function stable Cardiovascular status: blood pressure returned to baseline and stable Postop Assessment: no headache and no apparent nausea or vomiting Anesthetic complications: no Comments: Late entry   No notable events documented.   Last Vitals:  Vitals:   10/22/23 1956 10/23/23 0541  BP: 124/80 (!) 130/93  Pulse: 72 79  Resp: 20 20  Temp: 36.9 C 36.6 C  SpO2: 97% 98%    Last Pain:  Vitals:   10/23/23 0900  TempSrc:   PainSc: 0-No pain                 Yvonna PARAS Bos[place]

## 2023-11-05 ENCOUNTER — Encounter: Payer: Self-pay | Admitting: Surgery

## 2023-11-05 ENCOUNTER — Ambulatory Visit: Admitting: Surgery

## 2023-11-05 VITALS — BP 140/96 | HR 89 | Temp 98.0°F | Resp 16 | Ht 72.0 in | Wt 201.0 lb

## 2023-11-05 DIAGNOSIS — Z09 Encounter for follow-up examination after completed treatment for conditions other than malignant neoplasm: Secondary | ICD-10-CM

## 2023-11-05 NOTE — Progress Notes (Unsigned)
 [facility] Note   HPI:  53 y.o. Male presents to clinic for post-op follow-up status post sigmoidectomy on 6/30 for colon cancer.  Patient has overall been doing well since the surgery.  He is tolerating a diet without nausea and vomiting, and moving his bowels without issue.  His pain is controlled and is not currently taking any pain medications.  He denies fevers and chills.  He denies issues with his incision site.  Review of Systems:  All other review of systems: otherwise negative   Vital Signs:  BP (!) 140/96   Pulse 89   Temp 98 F (36.7 C) (Oral)   Resp 16   Ht 6' (1.829 m)   Wt 201 lb (91.2 kg)   SpO2 96%   BMI 27.26 kg/m    Physical Exam:  Physical Exam Vitals reviewed.  Constitutional:      Appearance: Normal appearance.  Abdominal:     Comments: Abdomen soft, nondistended, no percussion tenderness, nontender to palpation; no rigidity, guarding, rebound tenderness; midline incision healing well with skin staples in place  Neurological:     Mental Status: He is alert.     Laboratory studies: None  Imaging:  None  Pathology: FINAL MICROSCOPIC DIAGNOSIS:  A. 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 ADDITIONAL DISTAL AND PROXIMAL MARGINS, ANATOMOSIS, EXCISION: Benign colon  ONCOLOGY TABLE:  COLON AND RECTUM, CARCINOMA:  Resection  Procedure: Segmental resection Tumor Site: Sigmoid colon Tumor Size: 4.0 x 3.1 x 0.6 cm Macroscopic Tumor Perforation: Not identified Histologic Type: Adenocarcinoma Histologic Grade: Moderately differentiated Multiple Primary Sites: Not applicable Tumor Extension: Tumor invades through muscularis propria Lymphovascular Invasion: Not identified Perineural Invasion: Not identified Treatment  Effect: No known presurgical therapy Margins:      Margin Status for Invasive Carcinoma: All margins negative for invasive carcinoma      Distance from Invasive Carcinoma to Radial (Circumferential) Margin: 6.2 cm from mesenteric margin      Distance from Invasive Carcinoma to Closest Mucosal Margin: 3.2 cm from proximal margin and 3.7 cm from distal margin (final margins are 3.5 cm and 4.2 cm respectively incorporating the donut dimensions)      Margin Status for Non-Invasive Tumor: All margins negative for dysplasia Regional Lymph Nodes:      Number of Lymph Nodes with Tumor: 1      Number of Lymph Nodes Examined: 16 Tumor Deposits: Not identified Distant Metastasis: Not applicable Pathologic Stage Classification (pTNM, AJCC 8th Edition): pT3, pN1a Ancillary Studies: MMR testing has been ordered; these results will be issued within an addendum Representative Tumor Block: A6, A7 Comments: None (v4.2.0.1)    **Clinically, margins were negative intra-operatively as documented in the operative note, and it is known that tissue specimens will shrink after removal from the body.**   Assessment:  53 y.o. yo Male who presents for follow-up status post sigmoidectomy on 6/30 for colon cancer.  Plan:  - Patient overall doing well since surgery.  Pain well-controlled, tolerating a diet, moving his bowels - Skin staples removed without issue - Advised that he needs to take it easy with no heavy lifting for an additional 2 weeks for a total of 4 weeks after surgery - Patient to follow-up with oncology to discuss need for chemotherapy - Follow up with me as needed  All of the above  recommendations were discussed with the patient, and all of patient's questions were answered to his expressed satisfaction.  Note: Portions of this report [place] have been transcribed using voice recognition software. Every effort has been made to ensure accuracy; however, inadvertent computerized transcription  errors [place] still be present.   Dorothyann Brittle, DO [facility] [address] [place] [address], [place] [address] [phone] (office)

## 2023-11-06 ENCOUNTER — Inpatient Hospital Stay: Attending: Oncology | Admitting: Oncology

## 2023-11-06 ENCOUNTER — Inpatient Hospital Stay

## 2023-11-06 VITALS — BP 124/86 | HR 75 | Temp 98.0°F | Resp 18 | Ht 72.0 in | Wt 202.4 lb

## 2023-11-06 DIAGNOSIS — K7689 Other specified diseases of liver: Secondary | ICD-10-CM | POA: Diagnosis not present

## 2023-11-06 DIAGNOSIS — Z87891 Personal history of nicotine dependence: Secondary | ICD-10-CM | POA: Diagnosis not present

## 2023-11-06 DIAGNOSIS — D500 Iron deficiency anemia secondary to blood loss (chronic): Secondary | ICD-10-CM | POA: Insufficient documentation

## 2023-11-06 DIAGNOSIS — C187 Malignant neoplasm of sigmoid colon: Secondary | ICD-10-CM | POA: Insufficient documentation

## 2023-11-06 DIAGNOSIS — C189 Malignant neoplasm of colon, unspecified: Secondary | ICD-10-CM

## 2023-11-06 DIAGNOSIS — Z9049 Acquired absence of other specified parts of digestive tract: Secondary | ICD-10-CM | POA: Insufficient documentation

## 2023-11-06 LAB — CBC WITH DIFFERENTIAL/PLATELET
Abs Immature Granulocytes: 0.01 K/uL (ref 0.00–0.07)
Basophils Absolute: 0 K/uL (ref 0.0–0.1)
Basophils Relative: 1 %
Eosinophils Absolute: 0.1 K/uL (ref 0.0–0.5)
Eosinophils Relative: 3 %
HCT: 35.3 % — ABNORMAL LOW (ref 39.0–52.0)
Hemoglobin: 11.5 g/dL — ABNORMAL LOW (ref 13.0–17.0)
Immature Granulocytes: 0 %
Lymphocytes Relative: 37 %
Lymphs Abs: 1.8 K/uL (ref 0.7–4.0)
MCH: 29.4 pg (ref 26.0–34.0)
MCHC: 32.6 g/dL (ref 30.0–36.0)
MCV: 90.3 fL (ref 80.0–100.0)
Monocytes Absolute: 0.6 K/uL (ref 0.1–1.0)
Monocytes Relative: 12 %
Neutro Abs: 2.3 K/uL (ref 1.7–7.7)
Neutrophils Relative %: 47 %
Platelets: 387 K/uL (ref 150–400)
RBC: 3.91 MIL/uL — ABNORMAL LOW (ref 4.22–5.81)
RDW: 13.7 % (ref 11.5–15.5)
WBC: 4.8 K/uL (ref 4.0–10.5)
nRBC: 0 % (ref 0.0–0.2)

## 2023-11-06 LAB — IRON AND TIBC
Iron: 42 ug/dL — ABNORMAL LOW (ref 45–182)
Saturation Ratios: 10 % — ABNORMAL LOW (ref 17.9–39.5)
TIBC: 409 ug/dL (ref 250–450)
UIBC: 367 ug/dL

## 2023-11-06 LAB — VITAMIN B12: Vitamin B-12: 367 pg/mL (ref 180–914)

## 2023-11-06 LAB — FERRITIN: Ferritin: 15 ng/mL — ABNORMAL LOW (ref 24–336)

## 2023-11-06 LAB — COMPREHENSIVE METABOLIC PANEL WITH GFR
ALT: 23 U/L (ref 0–44)
AST: 20 U/L (ref 15–41)
Albumin: 4.2 g/dL (ref 3.5–5.0)
Alkaline Phosphatase: 69 U/L (ref 38–126)
Anion gap: 10 (ref 5–15)
BUN: 15 mg/dL (ref 6–20)
CO2: 27 mmol/L (ref 22–32)
Calcium: 9.4 mg/dL (ref 8.9–10.3)
Chloride: 104 mmol/L (ref 98–111)
Creatinine, Ser: 0.85 mg/dL (ref 0.61–1.24)
GFR, Estimated: >60 mL/min (ref >60)
Glucose, Bld: 102 mg/dL — ABNORMAL HIGH (ref 70–99)
Potassium: 4.1 mmol/L (ref 3.5–5.1)
Sodium: 141 mmol/L (ref 135–145)
Total Bilirubin: 0.7 mg/dL (ref 0.0–1.2)
Total Protein: 8 g/dL (ref 6.5–8.1)

## 2023-11-06 LAB — FOLATE: Folate: 11.7 ng/mL (ref >5.9)

## 2023-11-06 NOTE — Progress Notes (Signed)
START ON PATHWAY REGIMEN - Colorectal     A cycle is every 21 days:     Capecitabine      Oxaliplatin   **Always confirm dose/schedule in your pharmacy ordering system**  Patient Characteristics: Postoperative without Neoadjuvant Therapy, M0 (Pathologic Staging), Colon, Stage III, Low Risk (pT1-3, pN1) Tumor Location: Colon Therapeutic Status: Postoperative without Neoadjuvant Therapy, M0 (Pathologic Staging) AJCC M Category: cM0 AJCC T Category: pT3 AJCC N Category: pN1a AJCC 8 Stage Grouping: IIIB Intent of Therapy: Curative Intent, Discussed with Patient 

## 2023-11-06 NOTE — Progress Notes (Unsigned)
 Hematology-Oncology Clinic Note  Patient, No Pcp Per   Reason for Referral: Colon carcinoma  Oncology History: I have reviewed his chart and materials related to his cancer extensively and collaborated history with the patient. Summary of oncologic history is as follows:  Oncology History  Colon adenocarcinoma (HCC)  10/17/2023 Imaging   CT chest with contrast:  IMPRESSION: 1. No acute cardiopulmonary abnormalities. No highly suspicious findings to suggest metastatic disease to the chest. 2. There are 2 tiny nodules within the right lung measuring up to 3 mm. These are nonspecific. Attention on future surveillance imaging is advised in this patient with newly diagnosed colon cancer. 3. Multiple scattered low-attenuation lesions within both lobes of liver. Most of these are less than 1 cm and are technically too small to reliably characterize. The largest lesion is in the right lobe of liver measuring 1.4 x 0.8 cm. These are favored to represent benign cysts or hemangiomas.    10/17/2023 Imaging   CT angio abdomen and pelvis with contrast:  IMPRESSION: 1. Positive for active GI bleeding in the sigmoid colon. 2. Mild colonic wall thickening adjacent to the area of bleeding.  3. Small hypodense structures in liver probably represent hepatic cysts.  4. Mild narrowing of the proximal celiac trunk and the configuration is suggestive for median arcuate ligament compression. 5. Right inguinal hernia containing fat. Small umbilical hernia containing fat. 6. Focal low-density area in the prostate and question previous surgical intervention in this area.   10/18/2023 Imaging   MRI abdomen with and without contrast:  IMPRESSION: 1. Multiple scattered fluid signal liver cysts corresponding to findings of prior CT. No solid lesion or suspicious contrast enhancement. 2. No evidence of lymphadenopathy or metastatic disease in the abdomen. 3. Known sigmoid colon mass is partially included on  selected coronal sequences.   10/20/2023 Initial Diagnosis   Colon adenocarcinoma (HCC)   10/20/2023 Pathology Results   FINAL MICROSCOPIC DIAGNOSIS:  A. SIGMOID COLON, MASS, SEGMENTAL RESECTION: Invasive moderately differentiated adenocarcinoma invading through muscularis propria (pT3) Tumor measures 4.0 x 3.1 x 0.6 cm Margins free One of 16 pericolic lymph nodes with metastatic carcinoma (pN1a)  ONCOLOGY TABLE:  Tumor Extension: Tumor invades through muscularis propria Lymphovascular Invasion: Not identified Perineural Invasion: Not identified Tumor Deposits: Not identified Distant Metastasis: Not applicable Pathologic Stage Classification (pTNM, AJCC 8th Edition): pT3, pN1a  IHC EXPRESSION RESULTS   TEST           RESULT  MLH1:          Preserved nuclear expression  MSH2:          Preserved nuclear expression  MSH6:          Preserved nuclear expression  PMS2:          Preserved nuclear expression     11/06/2023 Cancer Staging   Staging form: Colon and Rectum, AJCC 8th Edition - Pathologic stage from 11/06/2023: Stage IIIB (pT3, pN1a, cM0) - Signed by Davonna Siad, MD on 11/06/2023 Stage prefix: Initial diagnosis Total positive nodes: 1       History of Presenting Illness: William Francis 52 y.o. male is referred by Dr.Shah for colon carcinoma.  Patient was recently admitted to the hospital on 10/16/2023 for anemia and then had a CT angio done consistent with colonic wall thickening.  He then had a colonoscopy that showed malignant sigmoid colon mass.  He then had a colectomy done consistent with the same diagnosis.  He is now here to discuss adjuvant  treatment options.  Patient is currently on inmate and is accompanied by an Technical sales engineer.  Patient reported no complaints today.  Is doing very well.  He denies hematochezia, melena, constipation, weight loss, loss of appetite.  He has no known family history of colon cancer, although his mother died of an unspecified stomach  cancer. He has a brother and a 74 year old daughter.  He quit smoking in 2010 and denies any current or past drug or alcohol use. He is physically active with no limitations.  Medical History: No past medical history on file.  Surgical history: Past Surgical History:  Procedure Laterality Date   COLONOSCOPY N/A 10/17/2023   Procedure: COLONOSCOPY;  Surgeon: Eartha Angelia Sieving, MD;  [facility];  Service: Gastroenterology;  Laterality: N/A;   PARTIAL COLECTOMY N/A 10/20/2023   Procedure: COLECTOMY, PARTIAL;  Surgeon: Evonnie Dorothyann LABOR, DO;  Location: [facility];  Service: General;  Laterality: N/A;     Allergies:  is allergic to penicillins.  Medications:  Current Outpatient Medications  Medication Sig Dispense Refill   ondansetron  (ZOFRAN ) 4 MG tablet Take 1 tablet (4 mg total) by mouth every 6 (six) hours as needed for nausea. 20 tablet 0   oxyCODONE  (OXY IR/ROXICODONE ) 5 MG immediate release tablet Take 1 tablet (5 mg total) by mouth every 6 (six) hours as needed for moderate pain (pain score 4-6). 12 tablet 0   No current facility-administered medications for this visit.    Review of Systems: Constitutional: Denies fevers, chills or abnormal night sweats Eyes: Denies blurriness of vision, double vision or watery eyes Ears, nose, mouth, throat, and face: Denies mucositis or sore throat Respiratory: Denies cough, dyspnea or wheezes Cardiovascular: Denies palpitation, chest discomfort or lower extremity swelling Gastrointestinal:  Denies nausea, heartburn or change in bowel habits Skin: Denies abnormal skin rashes Lymphatics: Denies new lymphadenopathy or easy bruising Neurological:Denies numbness, tingling or new weaknesses Behavioral/Psych: Mood is stable, no new changes  All other systems were reviewed with the patient and are negative.  Physical Examination: ECOG PERFORMANCE STATUS: 0 - Asymptomatic  Vitals:   11/06/23 1000  BP: 124/86  Pulse: 75   Resp: 18  Temp: 98 F (36.7 C)  SpO2: 100%   Filed Weights   11/06/23 1000  Weight: 202 lb 6.4 oz (91.8 kg)    GENERAL:alert, no distress and comfortable SKIN: skin color, texture, turgor are normal, no rashes or significant lesions LUNGS: clear to auscultation and percussion with normal breathing effort HEART: regular rate & rhythm and no murmurs and no lower extremity edema ABDOMEN:abdomen soft, non-tender and normal bowel sounds Musculoskeletal:no cyanosis of digits and no clubbing  PSYCH: alert & oriented x 3 with fluent speech NEURO: no focal motor/sensory deficits   Laboratory Data: I have reviewed the data as listed Lab Results  Component Value Date   WBC 4.8 11/06/2023   HGB 11.5 (L) 11/06/2023   HCT 35.3 (L) 11/06/2023   MCV 90.3 11/06/2023   PLT 387 11/06/2023   Recent Labs    10/16/23 1034 10/17/23 0457 10/21/23 0433 10/23/23 0418 11/06/23 1031  NA 140   < > 136 140 141  K 3.9   < > 3.8 3.9 4.1  CL 104   < > 103 105 104  CO2 26   < > 22 27 27   GLUCOSE 109*   < > 117* 105* 102*  BUN 14   < > 8 11 15   CREATININE 0.96   < > 0.92 0.94 0.85  CALCIUM  9.0   < > 8.5* 8.6* 9.4  GFRNONAA >60   < > >60 >60 >60  PROT 7.3  --   --   --  8.0  ALBUMIN 4.1  --   --   --  4.2  AST 19  --   --   --  20  ALT 21  --   --   --  23  ALKPHOS 62  --   --   --  69  BILITOT 0.5  --   --   --  0.7   < > = values in this interval not displayed.    Radiographic Studies: I have personally reviewed the radiological images as listed and agreed with the findings in the report.  MR ABDOMEN W WO CONTRAST CLINICAL DATA:  Liver lesions suspected by prior CT, new diagnosis colon cancer  EXAM: MRI ABDOMEN WITHOUT AND WITH CONTRAST  TECHNIQUE: Multiplanar multisequence MR imaging of the abdomen was performed both before and after the administration of intravenous contrast.  CONTRAST:  9mL GADAVIST  GADOBUTROL  1 MMOL/ML IV SOLN  COMPARISON:  CT abdomen pelvis,  10/16/2023  FINDINGS: Lower chest: No acute abnormality.  Hepatobiliary: No solid liver abnormality is seen. Multiple scattered fluid signal liver cysts. No solid lesion or suspicious contrast enhancement. No gallstones, gallbladder wall thickening, or biliary dilatation.  Pancreas: Unremarkable. No pancreatic ductal dilatation or surrounding inflammatory changes.  Spleen: Normal in size without significant abnormality.  Adrenals/Urinary Tract: Adrenal glands are unremarkable. Kidneys are normal, without renal calculi, solid lesion, or hydronephrosis.  Stomach/Bowel: Stomach is within normal limits. Sigmoid colon mass is partially included on selected coronal sequences (series 3, image 25).  Vascular/Lymphatic: No significant vascular findings are present. No enlarged abdominal lymph nodes.  Other: No abdominal wall hernia or abnormality. No ascites.  Musculoskeletal: No acute or significant osseous findings.  IMPRESSION: 1. Multiple scattered fluid signal liver cysts corresponding to findings of prior CT. No solid lesion or suspicious contrast enhancement. 2. No evidence of lymphadenopathy or metastatic disease in the abdomen. 3. Known sigmoid colon mass is partially included on selected coronal sequences.  Electronically Signed   By: Marolyn JONETTA Jaksch M.D.   On: 10/18/2023 20:26 CT CHEST W CONTRAST CLINICAL DATA:  Colon cancer.  Staging. * Tracking Code: BO *  EXAM: CT CHEST WITH CONTRAST  TECHNIQUE: Multidetector CT imaging of the chest was performed during intravenous contrast administration.  RADIATION DOSE REDUCTION: This exam was performed according to the departmental dose-optimization program which includes automated exposure control, adjustment of the mA and/or kV according to patient size and/or use of iterative reconstruction technique.  CONTRAST:  75mL OMNIPAQUE  IOHEXOL  300 MG/ML  SOLN  COMPARISON:  None Available.  FINDINGS: Cardiovascular: Heart  size is normal. No pericardial effusion. No significant aortic or coronary artery atherosclerotic calcifications.  Mediastinum/Nodes: No enlarged mediastinal, hilar, or axillary lymph nodes. Thyroid gland, trachea, and esophagus demonstrate no significant findings.  Lungs/Pleura: No pleural effusion, airspace consolidation, atelectasis, or pneumothorax. The central airways are grossly patent. Tiny nodule within the right upper lobe measures 3 mm, image 49/3. In the posterior right lower lobe there is a 2 mm nodule, image 23/3.  Upper Abdomen: No acute abnormality. Multiple scattered low-attenuation lesions within both lobes of liver. The largest lesion is in the right lobe of liver measuring 1.4 x 0.8 cm and fluid attenuation, image 163/2. The remaining are less than 1 cm in technically too small to characterize.  Musculoskeletal: No chest wall abnormality. No acute  or significant osseous findings.  IMPRESSION: 1. No acute cardiopulmonary abnormalities. No highly suspicious findings to suggest metastatic disease to the chest. 2. There are 2 tiny nodules within the right lung measuring up to 3 mm. These are nonspecific. Attention on future surveillance imaging is advised in this patient with newly diagnosed colon cancer. 3. Multiple scattered low-attenuation lesions within both lobes of liver. Most of these are less than 1 cm and are technically too small to reliably characterize. The largest lesion is in the right lobe of liver measuring 1.4 x 0.8 cm. These are favored to represent benign cysts or hemangiomas. In a patient with newly diagnosed colon cancer consider more definitive characterization of the subcentimeter lesions with nonemergent dedicated abdominal MRI with and without contrast.  Electronically Signed   By: Waddell Calk M.D.   On: 10/18/2023 04:41    ASSESSMENT & PLAN:  Patient is a 53 y.o. male presenting for colon carcinoma  Assessment & Plan Colon  adenocarcinoma [facility]) Patient has a low risk stage III colon cancer, pMMR s/p hemicolectomy  -Discussed that he would need adjuvant chemotherapy to decrease the recurrence risk of the disease.  Discussed multiple chemotherapy regimens including CapeOx and FOLFOX and most common side effects associated with it.  Patient chose to go ahead with CapeOx for 3 months. - Discussed most common side effects with capecitabine-nausea, vomiting, diarrhea, loss of appetite and with oxaliplatin-peripheral neuropathy, nausea, vomiting. - Would also obtain  CT DNA testing today -Patient would require a colonoscopy 1 year from now. -Will obtain labs today including a CEA  Patient is getting transferred to a safe facility in [place] and would be transitioning care to there.  No further follow-up here required. Iron deficiency anemia due to chronic blood loss Patient likely has iron deficiency anemia due to chronic blood loss secondary to colon cancer  - Will obtain baseline labs today.  If iron deficient, will consider replacement.    Orders Placed This Encounter  Procedures   Ferritin    Standing Status:   Future    Number of Occurrences:   1    Expected Date:   11/06/2023    Expiration Date:   02/04/2024   Folate    Standing Status:   Future    Number of Occurrences:   1    Expected Date:   11/06/2023    Expiration Date:   02/04/2024   Vitamin B12    Standing Status:   Future    Number of Occurrences:   1    Expected Date:   11/06/2023    Expiration Date:   02/04/2024   CBC with Differential/Platelet    Standing Status:   Future    Number of Occurrences:   1    Expected Date:   11/06/2023    Expiration Date:   02/04/2024   Comprehensive metabolic panel with GFR    Standing Status:   Future    Number of Occurrences:   1    Expected Date:   11/06/2023    Expiration Date:   02/04/2024   Iron and TIBC    Standing Status:   Future    Number of Occurrences:   1    Expected Date:   11/06/2023     Expiration Date:   02/04/2024   CEA    Standing Status:   Future    Number of Occurrences:   1    Expected Date:   11/06/2023    Expiration Date:  02/04/2024    The total time spent in the appointment was 60 minutes encounter with patients including review of chart and various tests results, discussions about plan of care and coordination of care plan   All questions were answered. The patient knows to call the clinic with any problems, questions or concerns. No barriers to learning was detected.  Mickiel Dry, MD 7/18/20252:30 PM

## 2023-11-07 LAB — CEA: CEA: 1.8 ng/mL (ref 0.0–4.7)

## 2023-11-07 NOTE — Assessment & Plan Note (Addendum)
 Patient has a low risk stage III colon cancer, pMMR s/p hemicolectomy  -Discussed that he would need adjuvant chemotherapy to decrease the recurrence risk of the disease.  Discussed multiple chemotherapy regimens including CapeOx and FOLFOX and most common side effects associated with it.  Patient chose to go ahead with CapeOx for 3 months. - Discussed most common side effects with capecitabine-nausea, vomiting, diarrhea, loss of appetite and with oxaliplatin-peripheral neuropathy, nausea, vomiting. - Would also obtain  CT DNA testing today -Patient would require a colonoscopy 1 year from now. -Will obtain labs today including a CEA  Patient is getting transferred to a safe facility in [place] and would be transitioning care to there.  No further follow-up here required.

## 2023-11-26 ENCOUNTER — Telehealth: Payer: Self-pay | Admitting: *Deleted

## 2023-11-26 NOTE — Telephone Encounter (Signed)
 Received call from [facility] @ [facility] in [place] requesting notes and pertinent records for inmate in order to resume cancer care @ [facility].  Records faxed to [phone].  Dr. Davonna aware.

## 2023-12-01 NOTE — Progress Notes (Unsigned)
 Records faxed to [facility] for continued care at [phone]. Referral to be made to [facility] per [facility], this is where their inmates receive oncology care. [facility] to call [phone] or [phone] to schedule appointments. Referral placed to [facility] via telephone at [phone]

## 2023-12-01 NOTE — Telephone Encounter (Signed)
 Multidisciplinary Oncology Program Intake Form  Referral Receive Date:  8/11  Rapid Access Appointment : No -  Patient not in scope for Rapid Access  Reason for Referral:  Patient Diagnosis: colon ca Transfer of Care: Under active treatment Disease Group: Gastrointestinal  Specialty:  Medical Oncology  Referral Method:  Fax  Insurance Primary Insurance: [facility]   Authorization Obtained: Yes   Record Collection:   RECORDS - CARE EVERYWHERE,  Imaging Date Requested:  8/11 Date Received:  8/11 External Facility Name:  [facility] Phone / Fax:  [email or web address] Pt. Informed of possible charges associated with imaging? NA  Screening Questions:   If <43, is the patient interested in learning about [facility] Fertility Preservation? NA  Close the Loop Communication: Referring Provider Contacted: Yes Patient Contacted: NA  Welcome Packet  Date Sent: 8/11 Sent: Fax

## 2023-12-29 ENCOUNTER — Encounter (HOSPITAL_COMMUNITY): Payer: Self-pay | Admitting: Student

## 2024-02-25 ENCOUNTER — Encounter (INDEPENDENT_AMBULATORY_CARE_PROVIDER_SITE_OTHER): Payer: Self-pay | Admitting: Gastroenterology
